# Patient Record
Sex: Male | Born: 1960 | Race: White | Hispanic: No | Marital: Married | Smoking: Never smoker
Health system: Southern US, Community
[De-identification: ages and names within clinical notes are randomized; demographics above are authoritative.]

## PROBLEM LIST (undated history)

## (undated) DIAGNOSIS — I10 Essential (primary) hypertension: Secondary | ICD-10-CM

## (undated) DIAGNOSIS — Z974 Presence of external hearing-aid: Secondary | ICD-10-CM

## (undated) DIAGNOSIS — H9319 Tinnitus, unspecified ear: Secondary | ICD-10-CM

## (undated) DIAGNOSIS — L4050 Arthropathic psoriasis, unspecified: Secondary | ICD-10-CM

## (undated) DIAGNOSIS — K219 Gastro-esophageal reflux disease without esophagitis: Secondary | ICD-10-CM

## (undated) DIAGNOSIS — K760 Fatty (change of) liver, not elsewhere classified: Secondary | ICD-10-CM

## (undated) HISTORY — DX: Fatty (change of) liver, not elsewhere classified: K76.0

## (undated) HISTORY — DX: Essential (primary) hypertension: I10

## (undated) HISTORY — PX: OTHER SURGICAL HISTORY: SHX169

## (undated) HISTORY — PX: SHOULDER SURGERY: SHX246

## (undated) HISTORY — PX: ANKLE SURGERY: SHX546

## (undated) HISTORY — DX: Gastro-esophageal reflux disease without esophagitis: K21.9

## (undated) HISTORY — DX: Arthropathic psoriasis, unspecified: L40.50

## (undated) HISTORY — PX: KNEE SURGERY: SHX244

---

## 2002-05-14 ENCOUNTER — Emergency Department (HOSPITAL_COMMUNITY): Admission: EM | Admit: 2002-05-14 | Discharge: 2002-05-14 | Payer: Self-pay | Admitting: Emergency Medicine

## 2002-11-03 ENCOUNTER — Emergency Department (HOSPITAL_COMMUNITY): Admission: EM | Admit: 2002-11-03 | Discharge: 2002-11-03 | Payer: Self-pay | Admitting: Emergency Medicine

## 2003-01-18 ENCOUNTER — Encounter: Payer: Self-pay | Admitting: Internal Medicine

## 2003-01-18 ENCOUNTER — Ambulatory Visit (HOSPITAL_COMMUNITY): Admission: RE | Admit: 2003-01-18 | Discharge: 2003-01-18 | Payer: Self-pay | Admitting: Internal Medicine

## 2003-04-22 ENCOUNTER — Emergency Department (HOSPITAL_COMMUNITY): Admission: EM | Admit: 2003-04-22 | Discharge: 2003-04-22 | Payer: Self-pay | Admitting: Emergency Medicine

## 2005-07-31 ENCOUNTER — Ambulatory Visit (HOSPITAL_COMMUNITY): Admission: RE | Admit: 2005-07-31 | Discharge: 2005-07-31 | Payer: Self-pay | Admitting: Internal Medicine

## 2007-08-13 ENCOUNTER — Ambulatory Visit: Payer: Self-pay | Admitting: Internal Medicine

## 2007-10-02 ENCOUNTER — Ambulatory Visit: Payer: Self-pay | Admitting: Internal Medicine

## 2007-10-02 ENCOUNTER — Ambulatory Visit (HOSPITAL_COMMUNITY): Admission: RE | Admit: 2007-10-02 | Discharge: 2007-10-02 | Payer: Self-pay | Admitting: Internal Medicine

## 2007-10-02 HISTORY — PX: OTHER SURGICAL HISTORY: SHX169

## 2010-01-22 ENCOUNTER — Emergency Department (HOSPITAL_COMMUNITY): Admission: EM | Admit: 2010-01-22 | Discharge: 2010-01-22 | Payer: Self-pay | Admitting: Emergency Medicine

## 2010-12-27 ENCOUNTER — Encounter (HOSPITAL_BASED_OUTPATIENT_CLINIC_OR_DEPARTMENT_OTHER)
Admission: RE | Admit: 2010-12-27 | Discharge: 2010-12-27 | Disposition: A | Discharge status: Home / Self Care | Payer: PRIVATE HEALTH INSURANCE | Source: Ambulatory Visit | Attending: Orthopedic Surgery | Admitting: Orthopedic Surgery

## 2010-12-27 LAB — BASIC METABOLIC PANEL
BUN: 18 mg/dL (ref 6–23)
CO2: 23 mEq/L (ref 19–32)
Calcium: 9.5 mg/dL (ref 8.4–10.5)
Chloride: 104 mEq/L (ref 96–112)
Creatinine, Ser: 0.72 mg/dL (ref 0.4–1.5)
GFR calc Af Amer: >60 mL/min (ref >60)
GFR calc non Af Amer: >60 mL/min (ref >60)
Glucose, Bld: 221 mg/dL — ABNORMAL HIGH (ref 70–99)
Potassium: 4.1 mEq/L (ref 3.5–5.1)
Sodium: 137 mEq/L (ref 135–145)

## 2010-12-28 ENCOUNTER — Ambulatory Visit (HOSPITAL_BASED_OUTPATIENT_CLINIC_OR_DEPARTMENT_OTHER)
Admission: RE | Admit: 2010-12-28 | Discharge: 2010-12-28 | Disposition: A | Discharge status: Home / Self Care | Payer: PRIVATE HEALTH INSURANCE | Source: Ambulatory Visit | Attending: Orthopedic Surgery | Admitting: Orthopedic Surgery

## 2010-12-28 DIAGNOSIS — Z01812 Encounter for preprocedural laboratory examination: Secondary | ICD-10-CM | POA: Insufficient documentation

## 2010-12-28 DIAGNOSIS — M719 Bursopathy, unspecified: Secondary | ICD-10-CM | POA: Insufficient documentation

## 2010-12-28 DIAGNOSIS — Z5333 Arthroscopic surgical procedure converted to open procedure: Secondary | ICD-10-CM | POA: Insufficient documentation

## 2010-12-28 DIAGNOSIS — J45909 Unspecified asthma, uncomplicated: Secondary | ICD-10-CM | POA: Insufficient documentation

## 2010-12-28 DIAGNOSIS — M24119 Other articular cartilage disorders, unspecified shoulder: Secondary | ICD-10-CM | POA: Insufficient documentation

## 2010-12-28 DIAGNOSIS — M25819 Other specified joint disorders, unspecified shoulder: Secondary | ICD-10-CM | POA: Insufficient documentation

## 2010-12-28 DIAGNOSIS — M67919 Unspecified disorder of synovium and tendon, unspecified shoulder: Secondary | ICD-10-CM | POA: Insufficient documentation

## 2010-12-28 DIAGNOSIS — I10 Essential (primary) hypertension: Secondary | ICD-10-CM | POA: Insufficient documentation

## 2010-12-28 DIAGNOSIS — E119 Type 2 diabetes mellitus without complications: Secondary | ICD-10-CM | POA: Insufficient documentation

## 2010-12-28 DIAGNOSIS — M948X9 Other specified disorders of cartilage, unspecified sites: Secondary | ICD-10-CM | POA: Insufficient documentation

## 2010-12-28 LAB — GLUCOSE, CAPILLARY
Glucose-Capillary: 175 mg/dL — ABNORMAL HIGH (ref 70–99)
Glucose-Capillary: 224 mg/dL — ABNORMAL HIGH (ref 70–99)

## 2011-01-16 NOTE — Op Note (Signed)
Steve Mccormick, Steve Mccormick           ACCOUNT NO.:  [id number]  MEDICAL RECORD NO.:  [id number]          PATIENT TYPE:  LOCATION:                                 FACILITY:  PHYSICIAN:  Loreta Ave, M.D.      DATE OF BIRTH:  DATE OF PROCEDURE:  12/28/2010 DATE OF DISCHARGE:                              OPERATIVE REPORT   PREOPERATIVE DIAGNOSES:  Right shoulder impingement, rotator cuff tear. Marked distal clavicle osteolysis.  POSTOPERATIVE DIAGNOSES:  Right shoulder impingement, rotator cuff tear. Marked distal clavicle osteolysis, but also with extensive tearing of intraarticular portion biceps tendon and superior labrum.  A full- thickness tear of the entire crescent region supraspinatus tendon.  PROCEDURES:  Right shoulder exam under anesthesia, arthroscopy. Debridement of labrum.  Released resection of intraarticular portion biceps with an open reimplantation into the bicipital groove with a docking tunnel.  Bursectomy, acromioplasty, CA ligament release. Excision of distal clavicle.  Arthroscopic-assisted cuff repair, fiber weave suture x2, SwiveLock anchors x2.  SURGEON:  Loreta Ave, MD  ASSISTANT:  Genene Churn. Barry Dienes, [place], present throughout the entire case and necessary for timely completion of procedure.  ANESTHESIA:  General.  BLOOD LOSS:  Minimal.  SPECIMENS:  None.  CULTURES:  None.  COMPLICATIONS:  None.  DRESSING:  Soft compressive shoulder immobilizer.  PROCEDURE:  The patient was brought to the operating room and placed in the operating table in supine position.  After adequate anesthesia had been obtained, shoulder was examined.  Full motion, stable shoulder. Placed in a beach-chair position on the shoulder positioner, prepped and draped in the usual sterile fashion.  Three portals, anterior, posterior, and lateral.  Arthroscope was introduced.  The shoulder distended and inspected.  Full thickness tear, entire crescent region, supraspinatus  mobile, reparable.  Debrided.  Tuberosity roughened from above and below.  Complex tearing of superior labrum was debrided.  This extended into marked tearing of almost the entirety of the intraarticular portion biceps.  This was released, the intraarticular portion debrided for extraarticular tenodesis.  The labrum debrided to a stable surface.  Remaining structures in shoulders looked good.  Cannula was redirected subacromially.  Type 2-3 acromion.  Reactive bursitis. Bursa was resected.  Cuff was assessed and mobilized for repair. Acromioplasty to a type 1 acromion with shaver and high-speed bur releasing CA ligament.  Distal clavicle grade 4 changes.  Periarticular spurs, lateral centimeter of clavicle resected.  Adequacy of decompression confirmed viewing throughout.  Instruments and fluid were removed.  A small longitudinal incision was made over the bicipital groove.  Skin and subcutaneous tissue were divided.  Blunt dissection, splitting the deltoid exposing the bicipital groove.  Biceps tendon brought out, cut to appropriate length, and captured with a FiberWire suture.  A drill hole was made into the bicipital groove with 2 small exiting holes.  FiberWire was brought in the larger hole and half smaller holes.  This was then tensioned pulling the biceps into the docking tunnel and suture over top with the FiberWire.  Nice firm reimplantation confirmed.  The wound was irrigated and closed with Vicryl.  Arthroscope was reintroduced.  With the cannula laterally, I  captured the cuff with 2 horizontal mattress sutures with FiberWire. These were firmly anchored and fixed to the tuberosity with 2 SwiveLock anchors.  Nice firm watertight closure of the cuff achieved.  I could bring it through full passive motion without too much tension on the repair completion.  Instruments and fluid were removed.  Portals were closed with nylon.  The incision was closed, subcutaneous and subcuticular  Vicryl.  Sterile compressive dressing was applied. Shoulder mobilizer was applied.  Anesthesia was reversed.  Brought to the recovery room.  Tolerated the surgery well.  No complications.     Loreta Ave, M.D.     DFM/MEDQ  D:  12/28/2010  T:  12/29/2010  Job:  [id number]  Electronically Signed by Mckinley Jewel M.D. on 01/16/2011 01:31:08 PM

## 2011-01-23 NOTE — Consult Note (Signed)
NAME:  BION, TODOROV            ACCOUNT NO.:  [id number]   MEDICAL RECORD NO.:  [id number]          PATIENT TYPE:  AMB   LOCATION:  DAY                           FACILITY:  [facility]   PHYSICIAN:  R. Roetta Sessions, M.D. DATE OF BIRTH:  1960/12/19   DATE OF CONSULTATION:  DATE OF DISCHARGE:                                 CONSULTATION   PATIENT NAME:  Steve Mccormick.   MEDICAL RECORD NUMBER:  [id number].   DATE OF BIRTH:  [birth year].   DATE OF CONSULTATION:  August 13, 2007.   REASON FOR CONSULTATION:  Hemoccult positive stools.   PHYSICIAN REQUESTING CONSULTATION:  Kingsley Callander. Ouida Sills, MD.   PHYSICIAN COSIGNING NOTEJonathon Bellows, MD.   HISTORY OF PRESENT ILLNESS:  The patient is a 50 year old, Caucasian  gentleman, who presents today for further evaluation of hemoccult  positive stools recently detected on a digital rectal exam during a  physical exam.  The patient denies any fresh blood per rectum noted.  His bowel movements are regular.  Denies any abdominal pain, nausea, or  vomiting.  He occasionally has heartburn for which he takes Nexium.  Denies any dysphagia, odynophagia, or weight loss.  His maternal  grandmother had colorectal cancer in her 48s.  His mother has had a  history of colonic polyps.  He has never had a colonoscopy.   CURRENT MEDICATIONS:  1. Metformin 1000 mg daily.  2. Nexium 40 mg p.r.n.   ALLERGIES:  No known drug allergies.   PAST MEDICAL HISTORY:  Diabetes mellitus.   PAST SURGICAL HISTORY:  A left knee surgery for torn cartilage.   FAMILY HISTORY:  Mother is 29 years old and has a history of colonic  polyps, diabetes; maternal grandmother deceased secondary to colorectal  cancer in her 13s.   SOCIAL HISTORY:  He is married.  He has one living son.  One son died in  an MVA four years ago around age 81.  He works for the [employer].  He has never been a smoker.  He rarely consumes  alcohol.   REVIEW OF  SYSTEMS:  See HPI for GI.  CONSTITUTIONAL:  No unintentional  weight loss.  CARDIOPULMONARY:  No chest pain or shortness of breath.   PHYSICAL EXAMINATION:  VITAL SIGNS:  Weight 277, height 6 foot, temp  98.3, blood pressure 140/90, pulse 80.  GENERAL:  A pleasant, well-nourished, well-developed, Caucasian male in  no acute distress.  SKIN:  Warm and dry, no jaundice.  HEENT:  Sclerae are nonicteric, oropharyngeal mucosa moist and pink, no  lesions, erythema, or exudate.  NECK:  No lymphadenopathy or thyromegaly.  CHEST:  Lungs are clear to auscultation.  CARDIAC:  Regular rate and rhythm, normal S1 S2, no murmurs, rubs, or  gallops.  ABDOMEN:  Positive bowel sounds, obese but symmetrical, soft.  Nontender, nondistended, no organomegaly or masses, no rebound  tenderness or guarding, no abdominal bruits or hernias.  EXTREMITIES:  No edema.   IMPRESSION:  Mr. Pelc is a 50 year old, Caucasian gentleman, who  recently was found to have hemoccult-positive stool on digital  rectal  examination.  He has a family history of colorectal cancer and colonic  polyps.  He has never had a colonoscopy.  Would recommend colonoscopy  for further evaluation.   PLAN:  1. Colonoscopy in the near future with Dr. Jena Gauss.  2. He will take Metformin 500 mg the day of his prep.   I would like to thank Dr. Carylon Perches for allowing [place] to take part in the  care of this patient.      Tana Coast, P.AJonathon Bellows, M.D.  Electronically Signed    LL/MEDQ  D:  08/13/2007  T:  08/13/2007  Job:  [id number]   cc:   Kingsley Callander. Ouida Sills, MD  Fax: [phone]

## 2011-01-23 NOTE — Op Note (Signed)
NAME:  CHALMER, ZHENG            ACCOUNT NO.:  [id number]   MEDICAL RECORD NO.:  [id number]          PATIENT TYPE:  AMB   LOCATION:  DAY                           FACILITY:  [facility]   PHYSICIAN:  R. Roetta Sessions, M.D. DATE OF BIRTH:  [birth year]   DATE OF PROCEDURE:  10/02/2007  DATE OF DISCHARGE:                               OPERATIVE REPORT   PROCEDURE:  Ileocolonoscopy diagnostic.   INDICATIONS FOR PROCEDURE:  A 50 year old gentleman who was found to be  Hemoccult-positive on digital rectal exam recently.  He is not having  any lower GI tract symptoms such as hematochezia or melena; and no  abdominal pain.  No upper GI tract symptoms, aside from some heartburn  or reflux symptoms (which is well-controlled on Nexium 40 mg p.r.n.).   FAMILY HISTORY:  Significant in that mother has had colonic polyps, and  grandmother had colorectal cancer in her 48s.   Mr. Tith has never had his lower GI tract evaluated.  Colonoscopy is  now being done.  This approach has been discussed with the patient at  length.  The potential risks, benefits and alternatives have been  reviewed and questions answered.  He is agreeable.  Please see  documentation in the medical record.   PROCEDURE NOTE:  Oxygen saturation, blood pressure, pulse and  respirations were monitored throughout the entire procedure.  Conscious  sedation with Versed 5 mg IV, Demerol 125 mg IV in divided doses.  Instrument was Pentax video chip system.   FINDINGS:  Digital rectal exam revealed no abnormalities.  The prep was  adequate.  COLON:  Colonic mucosa was surveyed from the rectosigmoid junction  through to the left transverse right colon.  The appendiceal orifice,  ileocecal valve and cecum.  Mucosal surfaces were seen well, seen and  photographed for the record.  Terminal ileum was intubated to 10 cm.  From this level the scope was slowly withdrawn.  All previous mucosal  surfaces were again seen.  The patient was noted  to have scattered left-  sided diverticula, but the colonic mucosa appeared entirely normal as  did the terminal ileum mucosa.  The Scope was pulled down into the  rectum, where a  thorough examination of the rectal mucosa, including  retroflex view of the anal verge, demonstrated friable anal canal only.   The patient tolerated the procedure well and was reacted in endoscopy.   IMPRESSION:  Friable anal canal, otherwise normal rectum.  Left-sided  diverticula.  The colonic mucosa and  terminal ileum mucosa appeared  normal.   RECOMMENDATIONS:  1. Diverticulosis.  Literature provided for Mr. Castillo.  2. Unless the patient were to display other signs and symptoms of      gastrointestinal bleeding, no further evaluation was warranted at      this time.  I have recommended that he return for his next      colonoscopy in 5 years.      Jonathon Bellows, M.D.  Electronically Signed     RMR/MEDQ  D:  10/02/2007  T:  10/02/2007  Job:  [id number]   cc:  Kingsley Callander. Ouida Sills, MD  Fax: [phone]

## 2011-06-07 ENCOUNTER — Ambulatory Visit (HOSPITAL_BASED_OUTPATIENT_CLINIC_OR_DEPARTMENT_OTHER)
Admission: RE | Admit: 2011-06-07 | Discharge: 2011-06-07 | Disposition: A | Discharge status: Home / Self Care | Payer: PRIVATE HEALTH INSURANCE | Source: Ambulatory Visit | Attending: Orthopedic Surgery | Admitting: Orthopedic Surgery

## 2011-06-07 DIAGNOSIS — E119 Type 2 diabetes mellitus without complications: Secondary | ICD-10-CM | POA: Insufficient documentation

## 2011-06-07 DIAGNOSIS — K219 Gastro-esophageal reflux disease without esophagitis: Secondary | ICD-10-CM | POA: Insufficient documentation

## 2011-06-07 DIAGNOSIS — M23319 Other meniscus derangements, anterior horn of medial meniscus, unspecified knee: Secondary | ICD-10-CM | POA: Insufficient documentation

## 2011-06-07 DIAGNOSIS — J45909 Unspecified asthma, uncomplicated: Secondary | ICD-10-CM | POA: Insufficient documentation

## 2011-06-07 LAB — POCT I-STAT, CHEM 8
BUN: 19 mg/dL (ref 6–23)
Creatinine, Ser: 0.6 mg/dL (ref 0.50–1.35)
HCT: 41 % (ref 39.0–52.0)
Hemoglobin: 13.9 g/dL (ref 13.0–17.0)
Potassium: 4.3 mEq/L (ref 3.5–5.1)
Sodium: 136 mEq/L (ref 135–145)
TCO2: 20 mmol/L (ref 0–100)

## 2011-06-07 LAB — GLUCOSE, CAPILLARY: Glucose-Capillary: 153 mg/dL — ABNORMAL HIGH (ref 70–99)

## 2011-06-13 NOTE — Op Note (Signed)
  [name], Steve Mccormick            ACCOUNT NO.:  [id number]  MEDICAL RECORD NO.:  [id number]  LOCATION:                                 FACILITY:  PHYSICIAN:  Loreta Ave, M.D.      DATE OF BIRTH:  DATE OF PROCEDURE:  06/07/2011 DATE OF DISCHARGE:                              OPERATIVE REPORT   PREOPERATIVE DIAGNOSIS:  Right knee anterior third medial meniscus tear with parameniscal cyst.  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  A focal grade 2 to 3 chondral flap posterior aspect of the medial femoral condyle.  PROCEDURE:  Right knee exam under anesthesia arthroscopy.  Debridement, anterior horn of the medial meniscus, excision of cyst.  Chondroplasty, removal of chondral flap medial femoral condyle with chondroplasty of the trochlea.  SURGEON:  Loreta Ave, M.D.  ASSISTANT:  Genene Churn. Denton Meek.  ANESTHESIA:  General.  BLOOD LOSS:  Minimal.  TOURNIQUET:  Not employed.  SPECIMEN:  None.  COMPLICATIONS:  None.  DRESSING:  Soft compressive.  PROCEDURE:  The patient brought to the operating room, placed in the operating table supine position.  After adequate anesthesia had been obtained, leg holder applied.  Leg prepped and draped in usual sterile fashion.  Two portals, one each in medial and lateral parapatellar. Arthroscope was introduced.  Knee was distended and inspected. Patellofemoral joint good tracking.  A little grade 2 changes laterally, debrided.  Inferior aspect of trochlea centrally contained lesion grade 3, debrided throughout.  Some of this approaching grade 4 thin but it was a contained lesion.  Cruciate ligaments were intact.  Lateral meniscus, lateral compartment normal.  Parameniscal cyst with fraying tearing of the anterior third medial meniscus.  Anterior third removed along the cyst, tapered into medial meniscus, salvaging  the posterior two thirds which were intact.  On the posterior aspect of the medial femoral condyle, there was a chondral flap 1 x 1 cm.  The flap which was unstable removed.  Chondroplasty to a stable surface.  Not full- thickness.  Entire knee examined.  No other findings appreciated. Instruments, fluid removed.  Portals were injected with Marcaine.  Knee injected with Depo- Medrol.  Portals closed with nylon.  Sterile compressive dressing applied.  Anesthesia reversed.  Brought to the recovery room.  Tolerated surgery well.  No complications.     Loreta Ave, M.D.     DFM/MEDQ  D:  06/07/2011  T:  06/07/2011  Job:  [id number]  Electronically Signed by Mckinley Jewel M.D. on 06/13/2011 02:32:54 PM

## 2011-12-15 ENCOUNTER — Encounter (HOSPITAL_COMMUNITY): Payer: Self-pay | Admitting: Emergency Medicine

## 2011-12-15 ENCOUNTER — Emergency Department (HOSPITAL_COMMUNITY): Payer: PRIVATE HEALTH INSURANCE

## 2011-12-15 ENCOUNTER — Emergency Department (HOSPITAL_COMMUNITY)
Admission: EM | Admit: 2011-12-15 | Discharge: 2011-12-15 | Disposition: A | Discharge status: Home / Self Care | Payer: PRIVATE HEALTH INSURANCE | Attending: Emergency Medicine | Admitting: Emergency Medicine

## 2011-12-15 DIAGNOSIS — M545 Low back pain, unspecified: Secondary | ICD-10-CM | POA: Insufficient documentation

## 2011-12-15 DIAGNOSIS — S39012A Strain of muscle, fascia and tendon of lower back, initial encounter: Secondary | ICD-10-CM

## 2011-12-15 DIAGNOSIS — M5136 Other intervertebral disc degeneration, lumbar region: Secondary | ICD-10-CM

## 2011-12-15 DIAGNOSIS — M5137 Other intervertebral disc degeneration, lumbosacral region: Secondary | ICD-10-CM | POA: Insufficient documentation

## 2011-12-15 DIAGNOSIS — R350 Frequency of micturition: Secondary | ICD-10-CM | POA: Insufficient documentation

## 2011-12-15 DIAGNOSIS — S335XXA Sprain of ligaments of lumbar spine, initial encounter: Secondary | ICD-10-CM | POA: Insufficient documentation

## 2011-12-15 DIAGNOSIS — X58XXXA Exposure to other specified factors, initial encounter: Secondary | ICD-10-CM | POA: Insufficient documentation

## 2011-12-15 DIAGNOSIS — M51379 Other intervertebral disc degeneration, lumbosacral region without mention of lumbar back pain or lower extremity pain: Secondary | ICD-10-CM | POA: Insufficient documentation

## 2011-12-15 DIAGNOSIS — E119 Type 2 diabetes mellitus without complications: Secondary | ICD-10-CM | POA: Insufficient documentation

## 2011-12-15 LAB — GLUCOSE, CAPILLARY: Glucose-Capillary: 127 mg/dL — ABNORMAL HIGH (ref 70–99)

## 2011-12-15 LAB — URINALYSIS, ROUTINE W REFLEX MICROSCOPIC
Glucose, UA: 100 mg/dL — AB
Leukocytes, UA: NEGATIVE
Nitrite: NEGATIVE
Protein, ur: NEGATIVE mg/dL
Urobilinogen, UA: 0.2 mg/dL (ref 0.0–1.0)
pH: 6 (ref 5.0–8.0)

## 2011-12-15 MED ORDER — OXYCODONE-ACETAMINOPHEN 5-325 MG PO TABS: 1.0000 | ORAL_TABLET | ORAL | Status: AC | PRN

## 2011-12-15 MED ORDER — METHOCARBAMOL 500 MG PO TABS
1000.0000 mg | ORAL_TABLET | Freq: Once | ORAL | Status: AC
  Administered 2011-12-15: 1000 mg via ORAL
  Filled 2011-12-15: qty 2

## 2011-12-15 MED ORDER — METHOCARBAMOL 500 MG PO TABS: 1000.0000 mg | ORAL_TABLET | Freq: Three times a day (TID) | ORAL | Status: AC

## 2011-12-15 MED ORDER — HYDROMORPHONE HCL PF 2 MG/ML IJ SOLN
2.0000 mg | Freq: Once | INTRAMUSCULAR | Status: AC
  Administered 2011-12-15: 2 mg via INTRAMUSCULAR
  Filled 2011-12-15: qty 1

## 2011-12-15 MED ORDER — OXYCODONE-ACETAMINOPHEN 5-325 MG PO TABS
2.0000 | ORAL_TABLET | Freq: Once | ORAL | Status: AC
  Administered 2011-12-15: 2 via ORAL
  Filled 2011-12-15: qty 2

## 2011-12-15 MED ORDER — ONDANSETRON 8 MG PO TBDP
8.0000 mg | ORAL_TABLET | Freq: Once | ORAL | Status: AC
  Administered 2011-12-15: 8 mg via ORAL
  Filled 2011-12-15: qty 1

## 2011-12-15 NOTE — ED Notes (Signed)
Pt went camping last weekend and on the way home felt increasing back pain. PCP put pt on Prednisone.  Pt states pain got better but now really bad.

## 2011-12-15 NOTE — ED Notes (Signed)
Pt a/ox4. Resp even and unlabored. NAD at this time. D/C instructions reviewed with pt. Pt verbalized understanding. Pt ambulated to lobby with steady gate.  

## 2011-12-15 NOTE — Discharge Instructions (Signed)
Back Pain, Adult Low back pain is very common. About 1 in 5 people have back pain.The cause of low back pain is rarely dangerous. The pain often gets better over time.About half of people with a sudden onset of back pain feel better in just 2 weeks. About 8 in 10 people feel better by 6 weeks.  CAUSES Some common causes of back pain include:  Strain of the muscles or ligaments supporting the spine.   Wear and tear (degeneration) of the spinal discs.   Arthritis.   Direct injury to the back.  DIAGNOSIS Most of the time, the direct cause of low back pain is not known.However, back pain can be treated effectively even when the exact cause of the pain is unknown.Answering your caregiver's questions about your overall health and symptoms is one of the most accurate ways to make sure the cause of your pain is not dangerous. If your caregiver needs more information, he or she may order lab work or imaging tests (X-rays or MRIs).However, even if imaging tests show changes in your back, this usually does not require surgery. HOME CARE INSTRUCTIONS For many people, back pain returns.Since low back pain is rarely dangerous, it is often a condition that people can learn to manageon their own.   Remain active. It is stressful on the back to sit or stand in one place. Do not sit, drive, or stand in one place for more than 30 minutes at a time. Take short walks on level surfaces as soon as pain allows.Try to increase the length of time you walk each day.   Do not stay in bed.Resting more than 1 or 2 days can delay your recovery.   Do not avoid exercise or work.Your body is made to move.It is not dangerous to be active, even though your back may hurt.Your back will likely heal faster if you return to being active before your pain is gone.   Pay attention to your body when you bend and lift. Many people have less discomfortwhen lifting if they bend their knees, keep the load close to their  bodies,and avoid twisting. Often, the most comfortable positions are those that put less stress on your recovering back.   Find a comfortable position to sleep. Use a firm mattress and lie on your side with your knees slightly bent. If you lie on your back, put a pillow under your knees.   Only take over-the-counter or prescription medicines as directed by your caregiver. Over-the-counter medicines to reduce pain and inflammation are often the most helpful.Your caregiver may prescribe muscle relaxant drugs.These medicines help dull your pain so you can more quickly return to your normal activities and healthy exercise.   Put ice on the injured area.   Put ice in a plastic bag.   Place a towel between your skin and the bag.   Leave the ice on for 15 to 20 minutes, 3 to 4 times a day for the first 2 to 3 days. After that, ice and heat may be alternated to reduce pain and spasms.   Ask your caregiver about trying back exercises and gentle massage. This may be of some benefit.   Avoid feeling anxious or stressed.Stress increases muscle tension and can worsen back pain.It is important to recognize when you are anxious or stressed and learn ways to manage it.Exercise is a great option.  SEEK MEDICAL CARE IF:  You have pain that is not relieved with rest or medicine.   You have   pain that does not improve in 1 week.   You have new symptoms.   You are generally not feeling well.  SEEK IMMEDIATE MEDICAL CARE IF:   You have pain that radiates from your back into your legs.   You develop new bowel or bladder control problems.   You have unusual weakness or numbness in your arms or legs.   You develop nausea or vomiting.   You develop abdominal pain.   You feel faint.  Document Released: 08/27/2005 Document Revised: 08/16/2011 Document Reviewed: 01/15/2011 [facility] Patient Information 2012 [place], [place].  Continue using your prednisone.  Use the the other medicines as directed.   Do not drive within 4 hours of taking oxycodone as this will make you drowsy.  Avoid lifting,  Bending,  Twisting or any other activity that worsens your pain over the next week.  Apply a heating pad to your lower back 20 minutes several times daily.  You should get rechecked if your symptoms are not better over the next 5 days,  Or you develop increased pain,  Weakness in your leg(s) or loss of bladder or bowel function - these are symptoms of a worse injury.

## 2011-12-16 NOTE — ED Provider Notes (Signed)
History     CSN: [id number]  Arrival date & time 12/15/11  1827   First MD Initiated Contact with Patient 12/15/11 1841      Chief Complaint  Patient presents with  . Back Pain    (Consider location/radiation/quality/duration/timing/severity/associated sxs/prior treatment) Patient is a 51 y.o. male presenting with back pain. The history is provided by the patient.  Back Pain  This is a new problem. The current episode started more than 2 days ago. The problem occurs constantly. The problem has been gradually worsening. The pain is associated with no known injury (His pain started while driving home from a week camping at the beach.  He denies injury.). The pain is present in the lumbar spine. The quality of the pain is described as stabbing. The pain does not radiate. The pain is at a severity of 8/10. The pain is moderate. The symptoms are aggravated by bending, twisting and certain positions. Pertinent negatives include no chest pain, no fever, no numbness, no headaches, no abdominal pain, no bowel incontinence, no perianal numbness, no bladder incontinence, no leg pain, no paresthesias, no paresis, no tingling and no weakness. Associated symptoms comments: He has noticed urinating more frequently than normal,  Although states he goes frequently at baseline which he blames on his diabetes.  He denies dysuria,  Denies hematuria. . Treatments tried: PCP called in prednisone taper which he started yesterday. The treatment provided no relief.    Past Medical History  Diagnosis Date  . Diabetes mellitus     Past Surgical History  Procedure Date  . Joint replacement     No family history on file.  History  Substance Use Topics  . Smoking status: Never Smoker   . Smokeless tobacco: Not on file  . Alcohol Use: No      Review of Systems  Constitutional: Negative for fever.  HENT: Negative for congestion, sore throat and neck pain.   Eyes: Negative.   Respiratory: Negative for  chest tightness and shortness of breath.   Cardiovascular: Negative for chest pain.  Gastrointestinal: Negative for nausea, abdominal pain and bowel incontinence.  Genitourinary: Negative for bladder incontinence.  Musculoskeletal: Positive for back pain. Negative for joint swelling and arthralgias.  Skin: Negative.  Negative for rash and wound.  Neurological: Negative for dizziness, tingling, weakness, light-headedness, numbness, headaches and paresthesias.  Hematological: Negative.   Psychiatric/Behavioral: Negative.     Allergies  Review of patient's allergies indicates no known allergies.  Home Medications   Current Outpatient Rx  Name Route Sig Dispense Refill  . METHOCARBAMOL 500 MG PO TABS Oral Take 2 tablets (1,000 mg total) by mouth 3 (three) times daily. 30 tablet 0  . OXYCODONE-ACETAMINOPHEN 5-325 MG PO TABS Oral Take 1 tablet by mouth every 4 (four) hours as needed for pain. 20 tablet 0    BP 130/76  Pulse 98  Temp(Src) 97.7 F (36.5 C) (Oral)  Resp 20  Ht 6' (1.829 m)  Wt 265 lb (120.203 kg)  BMI 35.94 kg/m2  SpO2 98%  Physical Exam  Nursing note and vitals reviewed. Constitutional: He is oriented to person, place, and time. He appears well-developed and well-nourished.  HENT:  Head: Normocephalic.  Eyes: Conjunctivae are normal.  Neck: Normal range of motion. Neck supple.  Cardiovascular: Regular rhythm and intact distal pulses.        Pedal pulses normal.  Pulmonary/Chest: Effort normal. He has no wheezes.  Abdominal: Soft. Bowel sounds are normal. He exhibits no distension and no  mass.  Musculoskeletal: Normal range of motion. He exhibits no edema.       Lumbar back: He exhibits tenderness. He exhibits no swelling, no edema and no spasm.       Bilateral SLR positive at 30 degrees.  Neurological: He is alert and oriented to person, place, and time. He has normal strength. He displays no atrophy and no tremor. No cranial nerve deficit or sensory deficit.  Gait normal.  Reflex Scores:      Patellar reflexes are 2+ on the right side and 2+ on the left side.      Achilles reflexes are 2+ on the right side and 2+ on the left side.      No strength deficit noted in hip and knee flexor and extensor muscle groups.  Ankle flexion and extension intact.  Skin: Skin is warm and dry.  Psychiatric: He has a normal mood and affect.    ED Course  Procedures (including critical care time)  Labs Reviewed  URINALYSIS, ROUTINE W REFLEX MICROSCOPIC - Abnormal; Notable for the following:    Glucose, UA 100 (*)    All other components within normal limits  GLUCOSE, CAPILLARY - Abnormal; Notable for the following:    Glucose-Capillary 127 (*)    All other components within normal limits  LAB REPORT - SCANNED   Dg Lumbar Spine Complete  12/15/2011  *RADIOLOGY REPORT*  Clinical Data: Back pain  LUMBAR SPINE - COMPLETE 4+ VIEW  Comparison: None.  Findings: Negative for fracture.  Normal lumbar alignment.  Mild to moderate diffuse lumbar disc degeneration is present with relative sparing at L3-4.  Negative for pars defect.  IMPRESSION: Lumbar disc degeneration.  No acute bony abnormality.  Original Report Authenticated By: Camelia Phenes, M.D.     1. Lumbar strain   2. Degenerative disc disease, lumbar       MDM  Pt to continue steroid taper.  Prescribed percocet and robaxin.  Heat therapy.  Recheck by pcp if not improved over the next 4-5 days.  Discussed signs of worsened course.  No neuro deficit on exam or by history to suggest emergent or surgical presentation.  Xray reviewed.           Candis Musa, PA 12/16/11 1312

## 2011-12-16 NOTE — ED Provider Notes (Signed)
Medical screening examination/treatment/procedure(s) were performed by non-physician practitioner and as supervising physician I was immediately available for consultation/collaboration.   Glynn Octave, MD 12/16/11 [phone]

## 2011-12-31 ENCOUNTER — Other Ambulatory Visit: Payer: Self-pay | Admitting: Orthopedic Surgery

## 2011-12-31 DIAGNOSIS — IMO0002 Reserved for concepts with insufficient information to code with codable children: Secondary | ICD-10-CM

## 2012-01-01 ENCOUNTER — Ambulatory Visit
Admission: RE | Admit: 2012-01-01 | Discharge: 2012-01-01 | Disposition: A | Discharge status: Home / Self Care | Payer: PRIVATE HEALTH INSURANCE | Source: Ambulatory Visit | Attending: Orthopedic Surgery | Admitting: Orthopedic Surgery

## 2012-01-01 ENCOUNTER — Other Ambulatory Visit: Payer: Self-pay | Admitting: Orthopedic Surgery

## 2012-01-01 DIAGNOSIS — IMO0002 Reserved for concepts with insufficient information to code with codable children: Secondary | ICD-10-CM

## 2012-01-04 LAB — BODY FLUID CULTURE
Gram Stain: NONE SEEN
Organism ID, Bacteria: NO GROWTH

## 2012-01-06 LAB — ANAEROBIC CULTURE: Gram Stain: NONE SEEN

## 2012-08-19 ENCOUNTER — Encounter: Payer: Self-pay | Admitting: *Deleted

## 2012-09-19 ENCOUNTER — Telehealth: Payer: Self-pay | Admitting: *Deleted

## 2012-09-19 NOTE — Telephone Encounter (Signed)
Left a message for Steve Mccormick to call our office to set up an appointment for his screening TCS.

## 2012-10-06 ENCOUNTER — Encounter: Payer: Self-pay | Admitting: Internal Medicine

## 2012-10-07 ENCOUNTER — Other Ambulatory Visit: Payer: Self-pay | Admitting: Internal Medicine

## 2012-10-07 ENCOUNTER — Ambulatory Visit (INDEPENDENT_AMBULATORY_CARE_PROVIDER_SITE_OTHER): Payer: PRIVATE HEALTH INSURANCE | Admitting: Gastroenterology

## 2012-10-07 ENCOUNTER — Telehealth: Payer: Self-pay | Admitting: Gastroenterology

## 2012-10-07 ENCOUNTER — Encounter: Payer: Self-pay | Admitting: Gastroenterology

## 2012-10-07 VITALS — BP 131/70 | HR 88 | Temp 98.4°F | Ht 72.0 in | Wt 261.0 lb

## 2012-10-07 DIAGNOSIS — Z139 Encounter for screening, unspecified: Secondary | ICD-10-CM

## 2012-10-07 DIAGNOSIS — K760 Fatty (change of) liver, not elsewhere classified: Secondary | ICD-10-CM

## 2012-10-07 DIAGNOSIS — K7689 Other specified diseases of liver: Secondary | ICD-10-CM

## 2012-10-07 MED ORDER — SOD PICOSULFATE-MAG OX-CIT ACD 10-3.5-12 MG-GM-GM PO PACK: 1.0000 | PACK | ORAL | Status: DC

## 2012-10-07 NOTE — Telephone Encounter (Signed)
Please have patient only take 1/2 dose of oral diabetes medication the EVENING before. None the day of.  I discussed this with him at the time of the visit; please just remind him. Thanks!

## 2012-10-07 NOTE — Progress Notes (Signed)
Primary Care Physician:  Dr. Ouida Sills Primary Gastroenterologist:  Dr. Jena Gauss   Chief Complaint  Patient presents with  . Colonoscopy    HPI:   52 year old pleasant male with family history of colon polyps and second-degree relative with hx of colon cancer, presenting today for routine updated colonoscopy. He has no first-degree relatives with colon cancer. His last TCS was in 2009 by Dr. Jena Gauss, with a friable anal canal, left-sided diverticula. He also has a hx of fatty liver. Last [place] on file in remote past. He notes he recently had blood work done at PCP and was told his LFTs were normal. Denies abdominal pain, no change in bowel habits. No rectal bleeding. No loss of appetite, N/V. Working on losing weight, intentionally, through diet and exercise. Intermittent reflux but will take Nexium prn.   Past Medical History  Diagnosis Date  . Diabetes mellitus   . Hypertension   . GERD (gastroesophageal reflux disease)   . Fatty liver     Past Surgical History  Procedure Date  . Ileocolonoscopy 10/02/2007    RMR: Friable anal canal, otherwise normal rectum.  Left-sided diverticula.  The colonic mucosa and  terminal ileum mucosa appeared normal rectum, left-sided diverticula  . Plantar fasciitis   . Ankle surgery     bilateral  . Knee surgery     bilateral  . Shoulder surgery     bilateral    Current Outpatient Prescriptions  Medication Sig Dispense Refill  . cetirizine (ZYRTEC) 10 MG chewable tablet Chew 10 mg by mouth daily.      Marland Kitchen dicyclomine (BENTYL) 10 MG capsule Take 10 mg by mouth as needed.      Marland Kitchen esomeprazole (NEXIUM) 40 MG capsule Take 40 mg by mouth daily before breakfast.      . gabapentin (NEURONTIN) 600 MG tablet Take 600 mg by mouth daily.      Marland Kitchen glipiZIDE (GLUCOTROL) 10 MG tablet Take 10 mg by mouth daily.      . Liraglutide (VICTOZA [place]) Inject 1.2 mg into the skin.      Marland Kitchen lisinopril (PRINIVIL,ZESTRIL) 10 MG tablet Take 10 mg by mouth daily.      . metFORMIN  (GLUCOPHAGE) 1000 MG tablet Take 2,000 mg by mouth daily with breakfast.      . Sod Picosulfate-Mag Ox-Cit Acd 10-3.5-12 MG-GM-GM PACK Take 1 Container by mouth as directed.  1 each  0    Allergies as of 10/07/2012  . (No Known Allergies)    Family History  Problem Relation Age of Onset  . Colon cancer Maternal Grandmother   . Colon polyps Mother     History   Social History  . Marital Status: Married    Spouse Name: N/A    Number of Children: N/A  . Years of Education: N/A   Occupational History  . [employer]   Social History Main Topics  . Smoking status: Never Smoker   . Smokeless tobacco: Not on file  . Alcohol Use: No  . Drug Use: No  . Sexually Active: Not on file   Other Topics Concern  . Not on file   Social History Narrative  . No narrative on file    Review of Systems: Gen: Denies any fever, chills, fatigue, weight loss, lack of appetite.  CV: Denies chest pain, heart palpitations, peripheral edema, syncope.  Resp: Denies shortness of breath at rest or with exertion. Denies wheezing or cough.  GI: Denies dysphagia or odynophagia. Denies jaundice, hematemesis,  fecal incontinence. GU : Denies urinary burning, urinary frequency, urinary hesitancy MS: +joint pain, arthritis in back Derm: Denies rash, itching, dry skin Psych: Denies depression, anxiety, memory loss, and confusion Heme: Denies bruising, bleeding, and enlarged lymph nodes.  Physical Exam: BP 131/70  Pulse 88  Temp 98.4 F (36.9 C) (Oral)  Ht 6' (1.829 m)  Wt 261 lb (118.389 kg)  BMI 35.40 kg/m2 General:   Alert and oriented. Pleasant and cooperative. Well-nourished and well-developed.  Head:  Normocephalic and atraumatic. Eyes:  Without icterus, sclera clear and conjunctiva pink.  Ears:  Normal auditory acuity. Nose:  No deformity, discharge,  or lesions. Mouth:  No deformity or lesions, oral mucosa pink.  Neck:  Supple, without mass or thyromegaly. Lungs:  Clear to  auscultation bilaterally. No wheezes, rales, or rhonchi. No distress.  Heart:  S1, S2 present without murmurs appreciated.  Abdomen:  +BS, soft, non-tender and non-distended. No HSM noted. No guarding or rebound. No masses appreciated.  Rectal:  Deferred  Msk:  Symmetrical without gross deformities. Normal posture. Extremities:  Without clubbing or edema. Neurologic:  Alert and  oriented x4;  grossly normal neurologically. Skin:  Intact without significant lesions or rashes. Cervical Nodes:  No significant cervical adenopathy. Psych:  Alert and cooperative. Normal mood and affect.

## 2012-10-07 NOTE — Patient Instructions (Addendum)
We have set you up for a colonoscopy with Dr. Jena Gauss in the near future.  Enjoy the snow!

## 2012-10-08 ENCOUNTER — Encounter (HOSPITAL_COMMUNITY): Payer: Self-pay | Admitting: Pharmacy Technician

## 2012-10-08 ENCOUNTER — Encounter: Payer: Self-pay | Admitting: Gastroenterology

## 2012-10-08 DIAGNOSIS — K760 Fatty (change of) liver, not elsewhere classified: Secondary | ICD-10-CM | POA: Insufficient documentation

## 2012-10-08 DIAGNOSIS — Z139 Encounter for screening, unspecified: Secondary | ICD-10-CM | POA: Insufficient documentation

## 2012-10-08 NOTE — Progress Notes (Signed)
Faxed to PCP

## 2012-10-08 NOTE — Telephone Encounter (Signed)
Ive discussed this with the patient and he is aware

## 2012-10-08 NOTE — Assessment & Plan Note (Signed)
52 year old male due for routine screening colonoscopy; notable FH of colon polyps and 2nd degree relative with colon cancer. He is slated for screening this year. Last colonoscopy in 2009 without evidence of polyps by Dr. Jena Gauss. He has no lower GI symptoms, and he is purposefully trying to lose weight through diet and exercise.   Proceed with TCS with Dr. Jena Gauss in near future: the risks, benefits, and alternatives have been discussed with the patient in detail. The patient states understanding and desires to proceed.

## 2012-10-08 NOTE — Assessment & Plan Note (Signed)
Notes last LFTs were normal per his PCP. We will obtain these for our records. He was applauded on weight loss efforts and encouraged to continue.

## 2012-10-22 ENCOUNTER — Ambulatory Visit (HOSPITAL_COMMUNITY)
Admission: RE | Admit: 2012-10-22 | Discharge: 2012-10-22 | Disposition: A | Discharge status: Home / Self Care | Payer: PRIVATE HEALTH INSURANCE | Source: Ambulatory Visit | Attending: Internal Medicine | Admitting: Internal Medicine

## 2012-10-22 ENCOUNTER — Encounter (HOSPITAL_COMMUNITY): Payer: Self-pay | Admitting: *Deleted

## 2012-10-22 ENCOUNTER — Encounter (HOSPITAL_COMMUNITY)
Admission: RE | Disposition: A | Discharge status: Home / Self Care | Payer: Self-pay | Source: Ambulatory Visit | Attending: Internal Medicine

## 2012-10-22 DIAGNOSIS — K5730 Diverticulosis of large intestine without perforation or abscess without bleeding: Secondary | ICD-10-CM | POA: Insufficient documentation

## 2012-10-22 DIAGNOSIS — I10 Essential (primary) hypertension: Secondary | ICD-10-CM | POA: Insufficient documentation

## 2012-10-22 DIAGNOSIS — Z1211 Encounter for screening for malignant neoplasm of colon: Secondary | ICD-10-CM

## 2012-10-22 DIAGNOSIS — E119 Type 2 diabetes mellitus without complications: Secondary | ICD-10-CM | POA: Insufficient documentation

## 2012-10-22 DIAGNOSIS — Z85038 Personal history of other malignant neoplasm of large intestine: Secondary | ICD-10-CM | POA: Insufficient documentation

## 2012-10-22 DIAGNOSIS — Z01812 Encounter for preprocedural laboratory examination: Secondary | ICD-10-CM | POA: Insufficient documentation

## 2012-10-22 DIAGNOSIS — Z800 Family history of malignant neoplasm of digestive organs: Secondary | ICD-10-CM

## 2012-10-22 DIAGNOSIS — Z8371 Family history of colonic polyps: Secondary | ICD-10-CM

## 2012-10-22 HISTORY — PX: COLONOSCOPY: SHX5424

## 2012-10-22 LAB — GLUCOSE, CAPILLARY: Glucose-Capillary: 149 mg/dL — ABNORMAL HIGH (ref 70–99)

## 2012-10-22 SURGERY — COLONOSCOPY
Anesthesia: Moderate Sedation

## 2012-10-22 MED ORDER — MEPERIDINE HCL 100 MG/ML IJ SOLN
INTRAMUSCULAR | Status: AC
  Filled 2012-10-22: qty 1

## 2012-10-22 MED ORDER — ONDANSETRON HCL 4 MG/2ML IJ SOLN
INTRAMUSCULAR | Status: AC
  Filled 2012-10-22: qty 2

## 2012-10-22 MED ORDER — MIDAZOLAM HCL 5 MG/5ML IJ SOLN
INTRAMUSCULAR | Status: DC | PRN
  Administered 2012-10-22: 2 mg via INTRAVENOUS
  Administered 2012-10-22: 1 mg via INTRAVENOUS
  Administered 2012-10-22: 2 mg via INTRAVENOUS
  Administered 2012-10-22: 1 mg via INTRAVENOUS
  Administered 2012-10-22 (×2): 2 mg via INTRAVENOUS

## 2012-10-22 MED ORDER — MIDAZOLAM HCL 5 MG/5ML IJ SOLN
INTRAMUSCULAR | Status: AC
  Filled 2012-10-22: qty 10

## 2012-10-22 MED ORDER — STERILE WATER FOR IRRIGATION IR SOLN
Status: DC | PRN
  Administered 2012-10-22: 09:00:00

## 2012-10-22 MED ORDER — ONDANSETRON HCL 4 MG/2ML IJ SOLN
INTRAMUSCULAR | Status: DC | PRN
  Administered 2012-10-22: 4 mg via INTRAVENOUS

## 2012-10-22 MED ORDER — MEPERIDINE HCL 100 MG/ML IJ SOLN
INTRAMUSCULAR | Status: DC | PRN
  Administered 2012-10-22 (×2): 50 mg via INTRAVENOUS

## 2012-10-22 NOTE — H&P (View-Only) (Signed)
Primary Care Physician:  Dr. Fagan Primary Gastroenterologist:  Dr. Rourk   Chief Complaint  Patient presents with  . Colonoscopy    HPI:   51-year-old pleasant male with family history of colon polyps and second-degree relative with hx of colon cancer, presenting today for routine updated colonoscopy. He has no first-degree relatives with colon cancer. His last TCS was in 2009 by Dr. Rourk, with a friable anal canal, left-sided diverticula. He also has a hx of fatty liver. Last US on file in remote past. He notes he recently had blood work done at PCP and was told his LFTs were normal. Denies abdominal pain, no change in bowel habits. No rectal bleeding. No loss of appetite, N/V. Working on losing weight, intentionally, through diet and exercise. Intermittent reflux but will take Nexium prn.   Past Medical History  Diagnosis Date  . Diabetes mellitus   . Hypertension   . GERD (gastroesophageal reflux disease)   . Fatty liver     Past Surgical History  Procedure Date  . Ileocolonoscopy 10/02/2007    RMR: Friable anal canal, otherwise normal rectum.  Left-sided diverticula.  The colonic mucosa and  terminal ileum mucosa appeared normal rectum, left-sided diverticula  . Plantar fasciitis   . Ankle surgery     bilateral  . Knee surgery     bilateral  . Shoulder surgery     bilateral    Current Outpatient Prescriptions  Medication Sig Dispense Refill  . cetirizine (ZYRTEC) 10 MG chewable tablet Chew 10 mg by mouth daily.      . dicyclomine (BENTYL) 10 MG capsule Take 10 mg by mouth as needed.      . esomeprazole (NEXIUM) 40 MG capsule Take 40 mg by mouth daily before breakfast.      . gabapentin (NEURONTIN) 600 MG tablet Take 600 mg by mouth daily.      . glipiZIDE (GLUCOTROL) 10 MG tablet Take 10 mg by mouth daily.      . Liraglutide (VICTOZA [place]) Inject 1.2 mg into the skin.      . lisinopril (PRINIVIL,ZESTRIL) 10 MG tablet Take 10 mg by mouth daily.      . metFORMIN  (GLUCOPHAGE) 1000 MG tablet Take 2,000 mg by mouth daily with breakfast.      . Sod Picosulfate-Mag Ox-Cit Acd 10-3.5-12 MG-GM-GM PACK Take 1 Container by mouth as directed.  1 each  0    Allergies as of 10/07/2012  . (No Known Allergies)    Family History  Problem Relation Age of Onset  . Colon cancer Maternal Grandmother   . Colon polyps Mother     History   Social History  . Marital Status: Married    Spouse Name: N/A    Number of Children: N/A  . Years of Education: N/A   Occupational History  . [employer]   Social History Main Topics  . Smoking status: Never Smoker   . Smokeless tobacco: Not on file  . Alcohol Use: No  . Drug Use: No  . Sexually Active: Not on file   Other Topics Concern  . Not on file   Social History Narrative  . No narrative on file    Review of Systems: Gen: Denies any fever, chills, fatigue, weight loss, lack of appetite.  CV: Denies chest pain, heart palpitations, peripheral edema, syncope.  Resp: Denies shortness of breath at rest or with exertion. Denies wheezing or cough.  GI: Denies dysphagia or odynophagia. Denies jaundice, hematemesis,   fecal incontinence. GU : Denies urinary burning, urinary frequency, urinary hesitancy MS: +joint pain, arthritis in back Derm: Denies rash, itching, dry skin Psych: Denies depression, anxiety, memory loss, and confusion Heme: Denies bruising, bleeding, and enlarged lymph nodes.  Physical Exam: BP 131/70  Pulse 88  Temp 98.4 F (36.9 C) (Oral)  Ht 6' (1.829 m)  Wt 261 lb (118.389 kg)  BMI 35.40 kg/m2 General:   Alert and oriented. Pleasant and cooperative. Well-nourished and well-developed.  Head:  Normocephalic and atraumatic. Eyes:  Without icterus, sclera clear and conjunctiva pink.  Ears:  Normal auditory acuity. Nose:  No deformity, discharge,  or lesions. Mouth:  No deformity or lesions, oral mucosa pink.  Neck:  Supple, without mass or thyromegaly. Lungs:  Clear to  auscultation bilaterally. No wheezes, rales, or rhonchi. No distress.  Heart:  S1, S2 present without murmurs appreciated.  Abdomen:  +BS, soft, non-tender and non-distended. No HSM noted. No guarding or rebound. No masses appreciated.  Rectal:  Deferred  Msk:  Symmetrical without gross deformities. Normal posture. Extremities:  Without clubbing or edema. Neurologic:  Alert and  oriented x4;  grossly normal neurologically. Skin:  Intact without significant lesions or rashes. Cervical Nodes:  No significant cervical adenopathy. Psych:  Alert and cooperative. Normal mood and affect.    

## 2012-10-22 NOTE — Op Note (Signed)
[facility] [address] [place], [address]   COLONOSCOPY PROCEDURE REPORT  PATIENT: Steve, Mccormick  MR#:         [id number] BIRTHDATE: Oct 10, 1960 , 51  yrs. old GENDER: Male ENDOSCOPIST: R.  Roetta Sessions, MD FACP FACG REFERRED BY:  Carylon Perches, M.D. PROCEDURE DATE:  10/22/2012 PROCEDURE:     Screening colonoscopy  INDICATIONS: Positive family history colon polyps and cancer  INFORMED CONSENT:  The risks, benefits, alternatives and imponderables including but not limited to bleeding, perforation as well as the possibility of a missed lesion have been reviewed.  The potential for biopsy, lesion removal, etc. have also been discussed.  Questions have been answered.  All parties agreeable. Please see the history and physical in the medical record for more information.  MEDICATIONS: Versed 10 mg IV in Demerol 100 mg IV in divided doses. Zofran 4 mg IV  DESCRIPTION OF PROCEDURE:  After a digital rectal exam was performed, the Pentax Colonoscope [id number]  colonoscope was advanced from the anus through the rectum and colon to the area of the cecum, ileocecal valve and appendiceal orifice.  The cecum was deeply intubated.  These structures were well-seen and photographed for the record.  From the level of the cecum and ileocecal valve, the scope was slowly and cautiously withdrawn.  The mucosal surfaces were carefully surveyed utilizing scope tip deflection to facilitate fold flattening as needed.  The scope was pulled down into the rectum where a thorough examination including retroflexion was performed.    FINDINGS:  Adequate preparation. Normal rectum. Sigmoid diverticula; the remainder of the colonic mucosa appeared normal.  THERAPEUTIC / DIAGNOSTIC MANEUVERS PERFORMED:  None  COMPLICATIONS: none  CECAL WITHDRAWAL TIME:  11 minutes  IMPRESSION:  Colonic diverticulosis  RECOMMENDATIONS: Repeat colonoscopy in 5  years   _______________________________ eSigned:  R. Roetta Sessions, MD FACP [facility] 10/22/2012 9:18 AM   CC:

## 2012-10-22 NOTE — Interval H&P Note (Signed)
History and Physical Interval Note:  10/22/2012 8:32 AM  Steve Mccormick  has presented today for surgery, with the diagnosis of SCREENING  The various methods of treatment have been discussed with the patient and family. After consideration of risks, benefits and other options for treatment, the patient has consented to  Procedure(s) with comments: COLONOSCOPY (N/A) - 8:30AM as a surgical intervention .  The patient's history has been reviewed, patient examined, no change in status, stable for surgery.  I have reviewed the patient's chart and labs.  Questions were answered to the patient's satisfaction.     [name]  Colonoscopy per plan. Primary Care Physician:  Carylon Perches, MD Primary Gastroenterologist:  Dr.  Donn Pierini risks, benefits, limitations, alternatives and imponderables have been reviewed with the patient. Questions have been answered. All parties are agreeable.

## 2012-10-27 ENCOUNTER — Encounter (HOSPITAL_COMMUNITY): Payer: Self-pay | Admitting: Internal Medicine

## 2013-06-17 ENCOUNTER — Ambulatory Visit
Admission: RE | Admit: 2013-06-17 | Discharge: 2013-06-17 | Disposition: A | Discharge status: Home / Self Care | Payer: PRIVATE HEALTH INSURANCE | Source: Ambulatory Visit | Attending: Pain Medicine | Admitting: Pain Medicine

## 2013-06-17 ENCOUNTER — Other Ambulatory Visit: Payer: Self-pay | Admitting: Pain Medicine

## 2013-06-17 DIAGNOSIS — M25552 Pain in left hip: Secondary | ICD-10-CM

## 2013-06-17 DIAGNOSIS — M25551 Pain in right hip: Secondary | ICD-10-CM

## 2014-01-06 ENCOUNTER — Other Ambulatory Visit: Payer: Self-pay | Admitting: Pain Medicine

## 2014-01-06 DIAGNOSIS — M542 Cervicalgia: Secondary | ICD-10-CM

## 2014-01-08 ENCOUNTER — Other Ambulatory Visit: Payer: Self-pay | Admitting: *Deleted

## 2014-01-08 DIAGNOSIS — G5790 Unspecified mononeuropathy of unspecified lower limb: Secondary | ICD-10-CM

## 2014-01-09 ENCOUNTER — Ambulatory Visit
Admission: RE | Admit: 2014-01-09 | Discharge: 2014-01-09 | Disposition: A | Discharge status: Home / Self Care | Payer: PRIVATE HEALTH INSURANCE | Source: Ambulatory Visit | Attending: Pain Medicine | Admitting: Pain Medicine

## 2014-01-09 DIAGNOSIS — M542 Cervicalgia: Secondary | ICD-10-CM

## 2014-01-20 ENCOUNTER — Telehealth: Payer: Self-pay | Admitting: Gastroenterology

## 2014-01-20 DIAGNOSIS — K760 Fatty (change of) liver, not elsewhere classified: Secondary | ICD-10-CM

## 2014-01-20 NOTE — Telephone Encounter (Signed)
Chelsey, will you get a copy of the labs please.

## 2014-01-20 NOTE — Telephone Encounter (Signed)
Hx of fatty liver.  IF he has had recent LFTs through PCP, please request. If not, we need a set of LFTs on file for our records

## 2014-01-25 NOTE — Telephone Encounter (Signed)
Records are in your cart.  

## 2014-01-25 NOTE — Telephone Encounter (Signed)
Requested labs

## 2014-02-08 ENCOUNTER — Other Ambulatory Visit: Payer: Self-pay | Admitting: Pain Medicine

## 2014-02-08 ENCOUNTER — Ambulatory Visit
Admission: RE | Admit: 2014-02-08 | Discharge: 2014-02-08 | Disposition: A | Discharge status: Home / Self Care | Payer: PRIVATE HEALTH INSURANCE | Source: Ambulatory Visit | Attending: Pain Medicine | Admitting: Pain Medicine

## 2014-02-08 DIAGNOSIS — M549 Dorsalgia, unspecified: Secondary | ICD-10-CM

## 2014-02-11 ENCOUNTER — Encounter: Payer: Self-pay | Admitting: Vascular Surgery

## 2014-02-12 ENCOUNTER — Encounter: Payer: Self-pay | Admitting: Vascular Surgery

## 2014-02-12 ENCOUNTER — Other Ambulatory Visit: Payer: Self-pay | Admitting: Vascular Surgery

## 2014-02-12 ENCOUNTER — Ambulatory Visit (INDEPENDENT_AMBULATORY_CARE_PROVIDER_SITE_OTHER): Payer: PRIVATE HEALTH INSURANCE | Admitting: Vascular Surgery

## 2014-02-12 ENCOUNTER — Ambulatory Visit (HOSPITAL_COMMUNITY)
Admission: RE | Admit: 2014-02-12 | Discharge: 2014-02-12 | Disposition: A | Discharge status: Home / Self Care | Payer: PRIVATE HEALTH INSURANCE | Source: Ambulatory Visit | Attending: Vascular Surgery | Admitting: Vascular Surgery

## 2014-02-12 ENCOUNTER — Encounter (INDEPENDENT_AMBULATORY_CARE_PROVIDER_SITE_OTHER): Payer: Self-pay

## 2014-02-12 VITALS — BP 119/78 | HR 85 | Ht 72.0 in | Wt 244.4 lb

## 2014-02-12 DIAGNOSIS — G5690 Unspecified mononeuropathy of unspecified upper limb: Secondary | ICD-10-CM

## 2014-02-12 DIAGNOSIS — I7300 Raynaud's syndrome without gangrene: Secondary | ICD-10-CM

## 2014-02-12 DIAGNOSIS — M069 Rheumatoid arthritis, unspecified: Secondary | ICD-10-CM

## 2014-02-12 DIAGNOSIS — G5790 Unspecified mononeuropathy of unspecified lower limb: Secondary | ICD-10-CM

## 2014-02-12 NOTE — Progress Notes (Signed)
Referred by:  Carylon Perches, MD [address] [place], [place] [address]  Reason for referral: Rule out arterial disease.   History of Present Illness  Steve Mccormick is a 53 y.o. (08-Nov-1960) male who presents for evaluation for of bilateral index finger discoloration and cold sensitivity. He has a past medical history of rheumatoid arthritis and osteoarthritis. He was referred here by his rheumatologist for evaluation of possible arterial disease. He describes his index fingers as turning "pale" and become painful in response to cold temperatures. These symptoms started occuring eight months ago. The symptoms are relieved with heat. He notes that sometimes the symptoms may last for an entire day. He denies a history of non-healing wounds on his hands and feet and denies intermittent claudication.   He has diabetes mellitus treated with metformin and glipizide. He has hypertension treated with an ace inhibitor.   Past Medical History  Diagnosis Date  . Diabetes mellitus   . Hypertension   . GERD (gastroesophageal reflux disease)   . Fatty liver     Past Surgical History  Procedure Laterality Date  . Ileocolonoscopy  10/02/2007    RMR: Friable anal canal, otherwise normal rectum.  Left-sided diverticula.  The colonic mucosa and  terminal ileum mucosa appeared normal rectum, left-sided diverticula  . Plantar fasciitis    . Ankle surgery      bilateral  . Knee surgery      bilateral  . Shoulder surgery      bilateral  . Colonoscopy N/A 10/22/2012    Procedure: COLONOSCOPY;  Surgeon: Corbin Ade, MD;  Location: [facility];  Service: Endoscopy;  Laterality: N/A;  8:30AM    History   Social History  . Marital Status: Married    Spouse Name: N/A    Number of Children: N/A  . Years of Education: N/A   Occupational History  . [employer]   Social History Main Topics  . Smoking status: Never Smoker   . Smokeless tobacco: Not on file  .  Alcohol Use: No  . Drug Use: No  . Sexual Activity: Not on file   Other Topics Concern  . Not on file   Social History Narrative  . No narrative on file    Family History  Problem Relation Age of Onset  . Colon cancer Maternal Grandmother   . Colon polyps Mother   . Diabetes Mother   . Hypertension Mother   . Hyperlipidemia Mother   . Diabetes Father     Current Outpatient Prescriptions on File Prior to Visit  Medication Sig Dispense Refill  . cetirizine (ZYRTEC) 10 MG tablet Take 10 mg by mouth daily as needed. Allergies      . esomeprazole (NEXIUM) 40 MG capsule Take 40 mg by mouth daily as needed. Acid Reflux      . glipiZIDE (GLUCOTROL) 10 MG tablet Take 10 mg by mouth daily.      . Liraglutide (VICTOZA [place]) Inject 1.2 mg into the skin.      Marland Kitchen lisinopril (PRINIVIL,ZESTRIL) 10 MG tablet Take 10 mg by mouth daily.      . metFORMIN (GLUCOPHAGE) 1000 MG tablet Take 2,000 mg by mouth 2 (two) times daily.       Marland Kitchen dicyclomine (BENTYL) 10 MG capsule Take 10 mg by mouth as needed. Stomach Cramps      . gabapentin (NEURONTIN) 600 MG tablet Take 600 mg by mouth daily.      Marland Kitchen  ibuprofen (ADVIL,MOTRIN) 200 MG tablet Take 200 mg by mouth every 6 (six) hours as needed. Pain      . Sod Picosulfate-Mag Ox-Cit Acd 10-3.5-12 MG-GM-GM PACK Take 1 Container by mouth as directed.  1 each  0   No current facility-administered medications on file prior to visit.    Allergies  Allergen Reactions  . Fish Allergy Anaphylaxis, Swelling and Other (See Comments)    Shaky   . Percocet [Oxycodone-Acetaminophen] Itching    REVIEW OF SYSTEMS:  (Positives checked otherwise negative)  CARDIOVASCULAR:  []  chest pain, []  chest pressure, []  palpitations, []  shortness of breath when laying flat, []  shortness of breath with exertion,  []  pain in feet when walking, []  pain in feet when laying flat, []  history of blood clot in veins (DVT), []  history of phlebitis, []  swelling in legs, []  varicose  veins  PULMONARY:  []  productive cough, []  asthma, []  wheezing  NEUROLOGIC:  []  weakness in arms or legs, []  numbness in arms or legs, []  difficulty speaking or slurred speech, []  temporary loss of vision in one eye, []  dizziness  HEMATOLOGIC:  []  bleeding problems, []  problems with blood clotting too easily  MUSCULOSKEL:  [x]  joint pain, []  joint swelling  GASTROINTEST:  []  vomiting blood, []  blood in stool     GENITOURINARY:  []  burning with urination, []  blood in urine  PSYCHIATRIC:  []  history of major depression  INTEGUMENTARY:  []  rashes, []  ulcers  CONSTITUTIONAL:  []  fever, []  chills  Physical Examination  Filed Vitals:   02/12/14 1401  BP: 119/78  Pulse: 85  Height: 6' (1.829 m)  Weight: 244 lb 6.4 oz (110.859 kg)  SpO2: 99%   Body mass index is 33.14 kg/(m^2).  General: A&O x 3, Obese male in NAD  Head: [place]/AT  Ear/Nose/Throat: Hearing grossly intact  Eyes: PERRLA, EOMI  Pulmonary: Sym exp, good air movt   Cardiac: RRR, Nl S1, S2, no Murmurs, rubs or gallops  Vascular: Vessel Right Left  Radial Palpable Palpable  Ulnar Palpable Palpable  Brachial Palpable Palpable    Musculoskeletal: M/S 5/5 throughout, Extremities without ischemic changes   Neurologic: CN 2-12 intact. Pain and light touch intact in extremities.  Motor exam as listed above  Psychiatric: Judgment intact, Mood & affect appropriate for pt's clinical situation,  Dermatologic: See M/S exam for extremity exam, no rashes otherwise noted  Lymph: no palpable LAD  Non-Invasive Vascular Imaging Upper Extremity Doppler Study R Wrist/Brachial Index 1.27 L Wrist/Brachial Index 1.25 -Bilateral upper extremity doppler waveforms and pressures are within normal limits at rest.  -Thoracic outlet maneuvers were performed and within normal limits.    Medical Decision Making  LEONIDUS ROWAND is a 53 y.o. male who presents with Raynaud's syndrome, known Rheumatoid arthritis  Do not  suspect any arterial disease. Normal doppler studies.   Discussed use of calcium channel blocker for peripheral vasodilation to alleviate symptoms. Mr. Coulthard is currently on an ace inhibitor for hypertension. Suspect use of an ace inhibitor for renal protection. Will defer change of antihypertensive medication to primary care provider.   Discussed use of topical nitroglycerin for relief of symptoms.   Advised patient if symptoms of prolonged ischemia occur to return for follow up.   , PA-C [facility] Office: [phone] Pager: [phone]  02/12/2014, 2:39 PM  Addendum  I have independently interviewed and examined the patient, and I agree with the physician assistant's findings.  Pt currently is not presenting  with his Raynaud's.  There is limited guidance in regards to long-term prophylaxis for such.  Pharmacologic therapy seems to be centered around calcium channel blockade and nitro-paste to finger prn.  I will defer to Dr. Ouida Sills possibly changing his anti-HTN agent to Norvasc to possibly prophylactic manage his Raynaud and also treat his HTN.  Avoidance of the known trigger in this patient, cold, is the primary prophylactic maneuver.    Leonides Sake, MD [facility] Office: [phone] Pager: [phone]  02/12/2014, 4:59 PM

## 2014-02-17 NOTE — Telephone Encounter (Signed)
The only labs I received are an A1c.  Needs set of LFTs for our records.

## 2014-02-26 ENCOUNTER — Other Ambulatory Visit: Payer: Self-pay

## 2014-02-26 DIAGNOSIS — K760 Fatty (change of) liver, not elsewhere classified: Secondary | ICD-10-CM

## 2014-02-26 NOTE — Telephone Encounter (Signed)
Letter and lab orders have been mailed to the pt.

## 2014-03-31 ENCOUNTER — Other Ambulatory Visit: Payer: Self-pay | Admitting: Pain Medicine

## 2014-03-31 DIAGNOSIS — M5137 Other intervertebral disc degeneration, lumbosacral region: Secondary | ICD-10-CM

## 2014-04-06 ENCOUNTER — Other Ambulatory Visit: Payer: PRIVATE HEALTH INSURANCE

## 2014-04-09 ENCOUNTER — Ambulatory Visit
Admission: RE | Admit: 2014-04-09 | Discharge: 2014-04-09 | Disposition: A | Discharge status: Home / Self Care | Payer: PRIVATE HEALTH INSURANCE | Source: Ambulatory Visit | Attending: Pain Medicine | Admitting: Pain Medicine

## 2014-04-09 DIAGNOSIS — M5137 Other intervertebral disc degeneration, lumbosacral region: Secondary | ICD-10-CM

## 2014-05-27 ENCOUNTER — Ambulatory Visit (HOSPITAL_COMMUNITY)
Admission: RE | Admit: 2014-05-27 | Discharge: 2014-05-27 | Disposition: A | Discharge status: Home / Self Care | Payer: PRIVATE HEALTH INSURANCE | Source: Ambulatory Visit | Attending: Pain Medicine | Admitting: Pain Medicine

## 2014-05-27 DIAGNOSIS — M545 Low back pain, unspecified: Secondary | ICD-10-CM

## 2014-05-27 DIAGNOSIS — M542 Cervicalgia: Secondary | ICD-10-CM

## 2014-05-27 DIAGNOSIS — IMO0001 Reserved for inherently not codable concepts without codable children: Secondary | ICD-10-CM | POA: Insufficient documentation

## 2014-05-27 DIAGNOSIS — M2560 Stiffness of unspecified joint, not elsewhere classified: Secondary | ICD-10-CM

## 2014-05-27 DIAGNOSIS — M6281 Muscle weakness (generalized): Secondary | ICD-10-CM

## 2014-05-27 DIAGNOSIS — M546 Pain in thoracic spine: Secondary | ICD-10-CM

## 2014-05-27 DIAGNOSIS — E119 Type 2 diabetes mellitus without complications: Secondary | ICD-10-CM | POA: Insufficient documentation

## 2014-05-27 DIAGNOSIS — I10 Essential (primary) hypertension: Secondary | ICD-10-CM | POA: Diagnosis not present

## 2014-05-27 DIAGNOSIS — R269 Unspecified abnormalities of gait and mobility: Secondary | ICD-10-CM | POA: Insufficient documentation

## 2014-05-27 DIAGNOSIS — M25659 Stiffness of unspecified hip, not elsewhere classified: Secondary | ICD-10-CM

## 2014-05-27 NOTE — Evaluation (Signed)
Physical Therapy Evaluation  Patient Details  Name: Steve Mccormick MRN: [id number] Date of Birth: February 15, 1961  Today's Date: 05/27/2014 Time: 1655-1730 PT Time Calculation (min): 35 min     Charges: 1 Evaluation, TE 1720-1730         Visit#: 1 of 6  Re-eval: 06/17/14 Assessment Diagnosis: cervicalgia, lumbago and thoracic lumbar spine Next MD Visit: Ardell Isaacs: 07/15/14 Prior Therapy: no  Authorization: [employer]     Past Medical History:  Past Medical History  Diagnosis Date  . Diabetes mellitus   . Hypertension   . GERD (gastroesophageal reflux disease)   . Fatty liver    Past Surgical History:  Past Surgical History  Procedure Laterality Date  . Ileocolonoscopy  10/02/2007    RMR: Friable anal canal, otherwise normal rectum.  Left-sided diverticula.  The colonic mucosa and  terminal ileum mucosa appeared normal rectum, left-sided diverticula  . Plantar fasciitis    . Ankle surgery      bilateral  . Knee surgery      bilateral  . Shoulder surgery      bilateral  . Colonoscopy N/A 10/22/2012    Procedure: COLONOSCOPY;  Surgeon: Corbin Ade, MD;  Location: [facility];  Service: Endoscopy;  Laterality: N/A;  8:30AM    Subjective Symptoms/Limitations Symptoms: Pain in mid thoracic spine, uppr lumbar spine and lower lumbar spine.  Pertinent History: patient has a long history of neck and back pain secondary to arthritis. MRI shows DDD, arthritics, bulging discs, seldum radiatign pain into LE, minor pain in hips,  How long can you sit comfortably?: <1 hour How long can you stand comfortably?: < 1 hour How long can you walk comfortably?: <55minutes Repetition: Increases Symptoms Patient Stated Goals: decrease, avoid any surgeries,  Pain Assessment Currently in Pain?: Yes Pain Score: 2  Pain Location: Back (back and nec) Pain Orientation: Right Pain Type: Chronic pain Pain Radiating Towards: ver seldom down leg. Pain Onset: More than a month ago Pain  Frequency: Constant Pain Relieving Factors: sitting in recliner Effect of Pain on Daily Activities: More pain with stooping forward, with working, with lifting  Cognition/Observation Observation/Other Assessments Observations: Gait: limiited toe out, excessive toe out, excessive pes cavs, limiited calcaneal inversion, limited tibial intermla rotation,  Other Assessments: FOTO 59% limited  Sensation/Coordination/Flexibility/Functional Tests Flexibility Thomas: Positive 90/90: Positive  Assessment RLE AROM (degrees) Right Ankle Dorsiflexion: 10 RLE Strength Right Hip Flexion: 5/5 Right Hip External Rotation : 40 Right Hip Internal Rotation : 2 Right Hip ABduction: 4/5 Right Knee Flexion: 4/5 Right Knee Extension: 5/5 LLE AROM (degrees) Left Hip External Rotation : 30 Left Hip Internal Rotation : 20 Left Ankle Dorsiflexion: 5 LLE Strength Left Hip Flexion: 5/5 Left Knee Flexion: 4/5 Left Knee Extension: 5/5 Cervical AROM Overall Cervical AROM Comments: Thoracic spine screened: limited mobility throughout with extension 50% limited Cervical Flexion: 55 Cervical Extension: 37 Cervical - Right Side Bend: painful Cervical - Left Side Bend: painful Cervical - Right Rotation: 57 Cervical - Left Rotation: 60 Cervical Strength Overall Cervical Strength Comments: to be assessed at later date Lumbar AROM Overall Lumbar AROM Comments: Sidebends Lt>Rt Lumbar Flexion: 85 patine throughtou with repeated ROM Lumbar Extension: 20 pain at L2 with repeated Lumbar - Right Rotation: 50% Lumbar - Left Rotation: 90% Lumbar Strength Overall Lumbar Strength Comments: to be assessed at later date Palpation Palpation: Pain with L4, L5, L1, T12-T8, T4,5, Limited C5,6 up and down glides  Exercise/Treatments hip flexor, piriforis, groin, calf stretch 2x 15  seconds each  3D hip excursion 10x  3D neck excursion 10x  Physical Therapy Assessment and Plan PT Assessment and Plan Clinical  Impression Statement: Patient displays Thoracic > Lumbar > cervical spine pain secondary to limited hip mobility (limited internal rotation, extension) limited ankle dorsiflexion resulting in decreased ability to decelerate landing during gait as well as limited lower cervical and thoracic mobility. 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  Pt will benefit from skilled therapeutic intervention in order to improve on the following deficits: Abnormal gait;Decreased activity tolerance;Decreased strength;Pain;Impaired flexibility;Improper body mechanics;Increased fascial restricitons;Decreased range of motion Rehab Potential: Good PT Frequency: Min 2X/week PT Duration: 8 weeks PT Treatment/Interventions: Gait training;Stair training;Functional mobility training;Therapeutic activities;Therapeutic exercise;Modalities;Manual techniques;Patient/family education PT Plan: Initial focus on increasing cervical, lumbar and thoracic spine mobility, as well as increasing hip extension and internal rotation and ankle dorsiflexion, Strength to be assessed at a later date. Introduce thoracic spine matrix    Goals Home Exercise Program Pt/caregiver will Perform Home Exercise Program: For increased ROM;For increased strengthening PT Goal: Perform Home Exercise Program - Progress: Goal set today PT Short Term Goals Time to Complete Short Term Goals: 4 weeks PT Short Term Goal 1: Patient will demosntrate increased ankle dorsiflexion to > 11 degrees bilaterally to decrease early heel off during gait PT Short Term Goal 2: Patient will dmeosntrate increased hip internal rotation bilaterally to >15 degrees to improve shock absorbtion mechanics during gait PT Short Term Goal 3: Patient demsontrate increased Lumbar extension to >30 degrees PT Short Term Goal 4: Patient demsontrate increased Thoracic  extension to WNL PT Short Term Goal 5: Patient will be able to rotate head Lt and Rt >70 degrees PT Long Term Goals Time to Complete Long Term Goals: 8 weeks PT Long Term Goal 1: Patient will dmeosntrate increased hip internal rotation bilaterally to >30 degrees to improve shock absorbtion mechanics during gait PT Long Term Goal 2: Patient will demosntrate increased glut max/med strength to > 4/5 MMT  Problem List Patient Active Problem List   Diagnosis Date Noted  . Stiffness of joint, not elsewhere classified, pelvic region and thigh 05/27/2014  . Stiffness of joints, not elsewhere classified, multiple sites 05/27/2014  . Lumbago 05/27/2014  . Cervicalgia 05/27/2014  . Pain in thoracic spine 05/27/2014  . Muscle weakness (generalized) 05/27/2014  . Raynaud's syndrome 02/12/2014  . Rheumatoid arthritis 02/12/2014  . Screening 10/08/2012  . Fatty liver 10/08/2012    PT - End of Session Activity Tolerance: Patient tolerated treatment well General Behavior During Therapy: WFL for tasks assessed/performed PT Plan of Care PT Home Exercise Plan: hip flexor, piriforis, groin, calf stretch 2x 15 seconds, 3D hip excursion, 3D neck excursion.   GP    Zissy Hamlett R 05/27/2014, 7:38 PM  Physician Documentation Your signature is required to indicate approval of the treatment plan as stated above.  Please sign and either send electronically or make a copy of this report for your files and return this physician signed original.   Please mark one 1.__approve of plan  2. ___approve of plan with the following conditions.   ______________________________  _____________________ Physician Signature                                                                                                             Date

## 2014-05-28 ENCOUNTER — Ambulatory Visit (HOSPITAL_COMMUNITY)
Admission: RE | Admit: 2014-05-28 | Discharge: 2014-05-28 | Disposition: A | Discharge status: Home / Self Care | Payer: PRIVATE HEALTH INSURANCE | Source: Ambulatory Visit | Attending: Internal Medicine | Admitting: Internal Medicine

## 2014-05-28 DIAGNOSIS — IMO0001 Reserved for inherently not codable concepts without codable children: Secondary | ICD-10-CM | POA: Diagnosis not present

## 2014-05-28 NOTE — Progress Notes (Signed)
Physical Therapy Treatment Patient Details  Name: Steve Mccormick MRN: [id number] Date of Birth: 03-30-1961  Today's Date: 05/28/2014 Time: 6160-7371 PT Time Calculation (min): 40 min 19' TE 15' manual  Visit#: 2 of 6  Re-eval: 06/17/14    Authorization: [employer]  Authorization Time Period:    Authorization Visit#:   of     Subjective: Symptoms/Limitations Symptoms: 5/10 pain in mid thoracic spine; no neck at present but earlier this morning 8/10 Pain Assessment Currently in Pain?: Yes Pain Score: 5  Pain Location: Neck Pain Orientation: Mid      Exercise/Treatments      Stretches Upper Trapezius Stretch: 3 reps;30 seconds Levator Stretch: 3 reps;30 seconds Lower Cervical/Upper Thoracic Stretch: 5 reps;10 seconds;Limitations (hands clasped anteriorly, rounding thoracic area) Seated Exercises Cervical Isometrics: 5 secs;5 reps;Extension Neck Retraction: 10 reps;3 secs       Stretches Active Hamstring Stretch: 3 reps;30 seconds;Limitations (3 way 14" box) Hip Flexor Stretch: 2 reps;30 seconds;Limitations (14" box)      Manual Therapy Manual Therapy: Massage Massage: STM to B cervical/trap area and lower R erector spinae to reduce tightness Myofascial Release: MFR to B medial scapulae  R more restrictions then left to reduce tightness/pain and increase mobility  Physical Therapy Assessment and Plan PT Assessment and Plan Clinical Impression Statement: Patient did not have c/o of any hip or lumbar pain today, however 5/10 thoracic spine pain and 8/10 cervical pain earlier in the day. Began stretches of cervical (isometrics as well), thoracic and lumbar spine and manual with STM and MFR to B scapulae, cervical area and R lower thoracic erector spinae. Patient reports decrease in pain of thoracic area after treatment today. Pt will benefit from skilled therapeutic intervention in order to improve on the following deficits: Decreased knowledge of precautions PT  Plan: Initial focus on increasing cervical, lumbar and thoracic spine mobility, as well as increasing hip extension and internal rotation and ankle dorsiflexion, Strength to be assessed at a later date. Introduce thoracic spine matrix    Goals    Problem List Patient Active Problem List   Diagnosis Date Noted  . Stiffness of joint, not elsewhere classified, pelvic region and thigh 05/27/2014  . Stiffness of joints, not elsewhere classified, multiple sites 05/27/2014  . Lumbago 05/27/2014  . Cervicalgia 05/27/2014  . Pain in thoracic spine 05/27/2014  . Muscle weakness (generalized) 05/27/2014  . Raynaud's syndrome 02/12/2014  . Rheumatoid arthritis 02/12/2014  . Screening 10/08/2012  . Fatty liver 10/08/2012    PT - End of Session Activity Tolerance: Patient tolerated treatment well General Behavior During Therapy: [facility] for tasks assessed/performed  GP    Audrey Eller ATKINSO 05/28/2014, 3:57 PM

## 2014-05-31 ENCOUNTER — Ambulatory Visit (HOSPITAL_COMMUNITY)
Admission: RE | Admit: 2014-05-31 | Discharge: 2014-05-31 | Disposition: A | Discharge status: Home / Self Care | Payer: PRIVATE HEALTH INSURANCE | Source: Ambulatory Visit | Attending: Internal Medicine | Admitting: Internal Medicine

## 2014-05-31 DIAGNOSIS — IMO0001 Reserved for inherently not codable concepts without codable children: Secondary | ICD-10-CM | POA: Diagnosis not present

## 2014-05-31 NOTE — Progress Notes (Signed)
Physical Therapy Treatment Patient Details  Name: Steve Mccormick MRN: [id number] Date of Birth: 1961-04-04  Today's Date: 05/31/2014 Time: 7371-0626 PT Time Calculation (min): 45 min TE 9485-4627, Estime/Ice 0350-0938  Visit#: 3 of 6  Re-eval: 06/17/14 Assessment Diagnosis: cervicalgia, lumbago and thoracic lumbar spine Next MD Visit: Ardell Isaacs: 07/15/14   Subjective: Symptoms/Limitations Symptoms: Pt reports he has been completing his HEP, though this has been causing some sharp pains.   Pain Assessment Pain Score: 6  Pain Location: Thoracic Pain Orientation: Right Pain Type: Chronic pain Multiple Pain Sites: Yes  Precautions/Restrictions     Exercise/Treatments Stretches Upper Trapezius Stretch: 3 reps;30 seconds Levator Stretch: 3 reps;30 seconds Seated Exercises Neck Retraction: 10 reps;3 secs Other Seated Exercise: 3D Thoracic Excursion with arms crossed x5 Supine Exercises Other Supine Exercise: Lat Slides x10 Stretches Active Hamstring Stretch: 3 reps;30 seconds;Limitations Active Hamstring Stretch Limitations: 14" Box Single Knee to Chest Stretch: 2 reps;20 seconds Piriformis Stretch: 3 reps;20 seconds;Limitations Piriformis Stretch Limitations: Seated Standing Other Standing Lumbar Exercises: Theme park manager, Slantboard 30" x3  Modalities Modalities: Electrical Stimulation;Cryotherapy Cryotherapy Number Minutes Cryotherapy: 5 Minutes Cryotherapy Location: Diplomatic Services operational officer Stimulation Location: thoracic spine Electrical Stimulation Parameters: IFC  Electrical Stimulation Goals: Pain  Physical Therapy Assessment and Plan PT Assessment and Plan Clinical Impression Statement: Pt reports complaints of pain from cervical spine down to lumbar spine, though most pain in cervical/thoracic region.  Pt was able to complete stretches, though some pain increases reported with upper trapezius and lumber rotation; educated pt to  decrease pull on upper trap and hold time on lumbar rotation to decrease pain.  Pt reports he has been using his traction unit on his neck, inversion table and TENS unit; pt reports he has only been using his TENS unit on his low back as he was unsure how to use it on his cervical/thoracic region.  Ice/estim applied in supine at end of treatment for 5 minutes to educated pt on techniques to continue at home.   Pt will benefit from skilled therapeutic intervention in order to improve on the following deficits: Decreased knowledge of precautions;Decreased activity tolerance;Decreased strength;Decreased mobility;Decreased range of motion PT Frequency: Min 2X/week PT Duration: 8 weeks PT Plan: Initial focus on increasing cervical, lumbar and thoracic spine mobility.  Strength to be assessed at a later date.     Goals PT Short Term Goals PT Short Term Goal 1: Patient will demosntrate increased ankle dorsiflexion to > 11 degrees bilaterally to decrease early heel off during gait PT Short Term Goal 1 - Progress: Progressing toward goal PT Short Term Goal 4: Patient demsontrate increased Thoracic extension to WNL PT Short Term Goal 4 - Progress: Progressing toward goal  Problem List Patient Active Problem List   Diagnosis Date Noted  . Stiffness of joint, not elsewhere classified, pelvic region and thigh 05/27/2014  . Stiffness of joints, not elsewhere classified, multiple sites 05/27/2014  . Lumbago 05/27/2014  . Cervicalgia 05/27/2014  . Pain in thoracic spine 05/27/2014  . Muscle weakness (generalized) 05/27/2014  . Raynaud's syndrome 02/12/2014  . Rheumatoid arthritis 02/12/2014  . Screening 10/08/2012  . Fatty liver 10/08/2012    PT - End of Session Activity Tolerance: Patient tolerated treatment well General Behavior During Therapy: [facility] for tasks assessed/performed  Tevan Marian 05/31/2014, 4:27 PM

## 2014-06-02 ENCOUNTER — Ambulatory Visit (HOSPITAL_COMMUNITY)
Admission: RE | Admit: 2014-06-02 | Discharge: 2014-06-02 | Disposition: A | Discharge status: Home / Self Care | Payer: PRIVATE HEALTH INSURANCE | Source: Ambulatory Visit | Attending: Pain Medicine | Admitting: Pain Medicine

## 2014-06-02 DIAGNOSIS — IMO0001 Reserved for inherently not codable concepts without codable children: Secondary | ICD-10-CM | POA: Diagnosis not present

## 2014-06-02 NOTE — Progress Notes (Signed)
Physical Therapy Treatment Patient Details  Name: Steve Mccormick MRN: [id number] Date of Birth: 14-Aug-1961  Today's Date: 06/02/2014 Time: 1135-1215 PT Time Calculation (min): 40 min   Charges: Manual 1135-1150, TE 2505-3976 Visit#: 4 of 6   Authorization: [employer]   Subjective: Symptoms/Limitations Symptoms: Patient states: "I don't feel much of a difference yet and my neck is really sore today." Pain Assessment Currently in Pain?: Yes Pain Score: 5  Pain Location: Neck  Exercise/Treatments Stretches Other Neck Stretches: 3D neck excursion 10x Seated Exercises Neck Retraction: 10 reps;3 secs Other Seated Exercise: cerivical spine matrix with 2lb dowel 10x Other Seated Exercise: 3D Thoracic Excursion with overhead reach x10 Stretches Piriformis Stretch: 3 reps;20 seconds;Limitations Piriformis Stretch Limitations: Seated Standing Other Standing Lumbar Exercises: Squat walk around 10x with therapist assist for form and Rt knee stabilization.   Manual Therapy Manual Therapy: Joint mobilization Joint Mobilization: up and down glides C5, C6 Massage: STM to B cervical/trap area   Physical Therapy Assessment and Plan PT Assessment and Plan Clinical Impression Statement: Patient reports decreased pain in cervical spine following soft tissue mobilization of upper trapezius and and suboccipitals as well as increased ROM following C5.C5 up glides, noted limitation on Rt C6 up glides. Patient demosntrated good performance of all exercises but needed adaptation of cervical spine matrix to a 8" box to decrease loading on Rt LE secondary to pain with lunging. Patient also demosntrated good performance of Thoracic spine excursion but had patient with Rt rotation. Session ended with squat walk around on Rt LE to increase Rt Hip internal rotation ROM with patient noting decreased low back pain and demonstrating improved gait with ddecreased Rt LE toe out.  PT Plan: Next session perform  exact same treatment/exercises to continue improvements made in cervical spine and progress hip mobility.     Goals PT Short Term Goals PT Short Term Goal 2: Patient will dmeosntrate increased hip internal rotation bilaterally to >15 degrees to improve shock absorbtion mechanics during gait PT Short Term Goal 2 - Progress: Progressing toward goal PT Short Term Goal 3: Patient demsontrate increased Lumbar extension to >30 degrees PT Short Term Goal 3 - Progress: Progressing toward goal PT Short Term Goal 4: Patient demsontrate increased Thoracic extension to WNL PT Short Term Goal 4 - Progress: Progressing toward goal PT Short Term Goal 5: Patient will be able to rotate head Lt and Rt >70 degrees PT Short Term Goal 5 - Progress: Progressing toward goal  Problem List Patient Active Problem List   Diagnosis Date Noted  . Stiffness of joint, not elsewhere classified, pelvic region and thigh 05/27/2014  . Stiffness of joints, not elsewhere classified, multiple sites 05/27/2014  . Lumbago 05/27/2014  . Cervicalgia 05/27/2014  . Pain in thoracic spine 05/27/2014  . Muscle weakness (generalized) 05/27/2014  . Raynaud's syndrome 02/12/2014  . Rheumatoid arthritis 02/12/2014  . Screening 10/08/2012  . Fatty liver 10/08/2012    PT - End of Session Activity Tolerance: Patient tolerated treatment well General Behavior During Therapy: [facility] for tasks assessed/performed  GP    Whitney Hillegass R 06/02/2014, 12:24 PM

## 2014-06-09 ENCOUNTER — Ambulatory Visit (HOSPITAL_COMMUNITY)
Admission: RE | Admit: 2014-06-09 | Discharge: 2014-06-09 | Disposition: A | Discharge status: Home / Self Care | Payer: PRIVATE HEALTH INSURANCE | Source: Ambulatory Visit | Attending: Physical Therapy | Admitting: Physical Therapy

## 2014-06-09 DIAGNOSIS — IMO0001 Reserved for inherently not codable concepts without codable children: Secondary | ICD-10-CM | POA: Diagnosis not present

## 2014-06-09 NOTE — Progress Notes (Signed)
Physical Therapy Treatment Patient Details  Name: Steve Mccormick MRN: [id number] Date of Birth: 04/24/1961  Today's Date: 06/09/2014 Time: 3612-2449 PT Time Calculation (min): 44 min 24' TE 15' manual  Visit#: 5 of 6  Re-eval: 06/17/14    Authorization: [employer]  Authorization Time Period:    Authorization Visit#:   of     Subjective: Symptoms/Limitations Symptoms: right today pain today 6/10;neck 3/10 pain no back pain. Pt also states that home exercises given last week increase back and hip pain during and after.      Exercise/Treatments       Stretches Other Neck Stretches: 3D neck excursion 10x   Seated Exercises Neck Retraction: 10 reps;3 secs Other Seated Exercise: cerivical spine matrix with 2lb dowel 10x     Stretches ITB Stretch: 3 reps;30 seconds;Limitations (foot on 4"box) Piriformis Stretch: 3 reps;20 seconds;Limitations Piriformis Stretch Limitations: Seated    Manual Therapy Joint Mobilization: C1,2,5,6 up down glides  (performed by [employer], DPT) Massage: STM to cervical trap area and supoccipital release as well strain counter strain to B TFL/ITB  Physical Therapy Assessment and Plan PT Assessment and Plan PT Plan: Next session perform exact same treatment/exercises to continue improvements made in cervical spine and progress hip mobility.;continue with PT POC, check with pt to see how doing cervical spine excursion seated help with pain (standing was increasing pain)    Goals    Problem List Patient Active Problem List   Diagnosis Date Noted  . Stiffness of joint, not elsewhere classified, pelvic region and thigh 05/27/2014  . Stiffness of joints, not elsewhere classified, multiple sites 05/27/2014  . Lumbago 05/27/2014  . Cervicalgia 05/27/2014  . Pain in thoracic spine 05/27/2014  . Muscle weakness (generalized) 05/27/2014  . Raynaud's syndrome 02/12/2014  . Rheumatoid arthritis 02/12/2014  . Screening 10/08/2012  . Fatty liver  10/08/2012    PT - End of Session Activity Tolerance: Patient tolerated treatment well General Behavior During Therapy: [facility] for tasks assessed/performed  GP    Loreen Bankson ATKINSO 06/09/2014, 4:00 PM

## 2014-06-11 ENCOUNTER — Ambulatory Visit (HOSPITAL_COMMUNITY): Payer: PRIVATE HEALTH INSURANCE

## 2014-06-21 ENCOUNTER — Telehealth (HOSPITAL_COMMUNITY): Payer: Self-pay

## 2014-06-21 ENCOUNTER — Ambulatory Visit (HOSPITAL_COMMUNITY): Payer: PRIVATE HEALTH INSURANCE | Admitting: Physical Therapy

## 2014-06-21 NOTE — Telephone Encounter (Signed)
cx - left a message to cx all appointments but no reason was given

## 2014-06-23 ENCOUNTER — Ambulatory Visit (HOSPITAL_COMMUNITY): Payer: PRIVATE HEALTH INSURANCE | Admitting: Physical Therapy

## 2014-06-28 ENCOUNTER — Ambulatory Visit (HOSPITAL_COMMUNITY): Payer: PRIVATE HEALTH INSURANCE

## 2014-06-30 ENCOUNTER — Ambulatory Visit (HOSPITAL_COMMUNITY): Payer: PRIVATE HEALTH INSURANCE | Admitting: Physical Therapy

## 2014-07-07 ENCOUNTER — Ambulatory Visit (HOSPITAL_COMMUNITY): Payer: PRIVATE HEALTH INSURANCE | Admitting: Physical Therapy

## 2014-07-09 ENCOUNTER — Ambulatory Visit (HOSPITAL_COMMUNITY): Payer: PRIVATE HEALTH INSURANCE | Admitting: Physical Therapy

## 2014-07-12 ENCOUNTER — Ambulatory Visit (HOSPITAL_COMMUNITY): Payer: PRIVATE HEALTH INSURANCE | Attending: Pain Medicine | Admitting: Physical Therapy

## 2014-08-12 ENCOUNTER — Encounter (HOSPITAL_COMMUNITY): Payer: Self-pay | Admitting: Physical Therapy

## 2014-08-12 NOTE — Therapy (Signed)
[facility] [address] [place], [place], [address] Phone: [phone]   Fax:  [phone]  Patient Details  Name: Steve Mccormick MRN: [id number] Date of Birth: February 19, 1961  Encounter Date: 08/12/2014  PHYSICAL THERAPY DISCHARGE SUMMARY  Visits from Start of Care: 5  Current functional level related to goals / functional outcomes: PT Short Term Goals Time to Complete Short Term Goals: 4 weeks PT Short Term Goal 1: Patient will demosntrate increased ankle dorsiflexion to > 11 degrees bilaterally to decrease early heel off during gait PT Short Term Goal 2: Patient will dmeosntrate increased hip internal rotation bilaterally to >15 degrees to improve shock absorbtion mechanics during gait PT Short Term Goal 3: Patient demsontrate increased Lumbar extension to >30 degrees PT Short Term Goal 4: Patient demsontrate increased Thoracic extension to WNL PT Short Term Goal 5: Patient will be able to rotate head Lt and Rt >70 degrees PT Long Term Goals Time to Complete Long Term Goals: 8 weeks PT Long Term Goal 1: Patient will dmeosntrate increased hip internal rotation bilaterally to >30 degrees to improve shock absorbtion mechanics during gait PT Long Term Goal 2: Patient will demosntrate increased glut max/med strength to > 4/5 MMT    Plan: Patient agrees to discharge.  Patient goals were not met. Patient is being discharged due to not returning since the last visit.  ?????        Devona Konig PT DPT [phone]

## 2014-10-11 ENCOUNTER — Other Ambulatory Visit: Payer: Self-pay | Admitting: Rheumatology

## 2014-10-13 ENCOUNTER — Other Ambulatory Visit: Payer: Self-pay | Admitting: Rheumatology

## 2014-10-13 DIAGNOSIS — M25552 Pain in left hip: Secondary | ICD-10-CM

## 2014-10-27 ENCOUNTER — Ambulatory Visit
Admission: RE | Admit: 2014-10-27 | Discharge: 2014-10-27 | Disposition: A | Discharge status: Home / Self Care | Payer: PRIVATE HEALTH INSURANCE | Source: Ambulatory Visit | Attending: Rheumatology | Admitting: Rheumatology

## 2014-10-27 DIAGNOSIS — M25552 Pain in left hip: Secondary | ICD-10-CM

## 2014-10-27 MED ORDER — GADOBENATE DIMEGLUMINE 529 MG/ML IV SOLN
20.0000 mL | Freq: Once | INTRAVENOUS | Status: AC | PRN
  Administered 2014-10-27: 20 mL via INTRAVENOUS

## 2015-05-22 ENCOUNTER — Encounter (HOSPITAL_COMMUNITY): Payer: Self-pay | Admitting: Emergency Medicine

## 2015-05-22 ENCOUNTER — Emergency Department (HOSPITAL_COMMUNITY)
Admission: EM | Admit: 2015-05-22 | Discharge: 2015-05-22 | Disposition: A | Discharge status: Home / Self Care | Payer: PRIVATE HEALTH INSURANCE | Attending: Emergency Medicine | Admitting: Emergency Medicine

## 2015-05-22 DIAGNOSIS — S0101XA Laceration without foreign body of scalp, initial encounter: Secondary | ICD-10-CM | POA: Diagnosis not present

## 2015-05-22 DIAGNOSIS — W2209XA Striking against other stationary object, initial encounter: Secondary | ICD-10-CM | POA: Diagnosis not present

## 2015-05-22 DIAGNOSIS — Y9389 Activity, other specified: Secondary | ICD-10-CM | POA: Diagnosis not present

## 2015-05-22 DIAGNOSIS — I10 Essential (primary) hypertension: Secondary | ICD-10-CM | POA: Diagnosis not present

## 2015-05-22 DIAGNOSIS — Y998 Other external cause status: Secondary | ICD-10-CM | POA: Diagnosis not present

## 2015-05-22 DIAGNOSIS — S0990XA Unspecified injury of head, initial encounter: Secondary | ICD-10-CM | POA: Diagnosis present

## 2015-05-22 DIAGNOSIS — Y9289 Other specified places as the place of occurrence of the external cause: Secondary | ICD-10-CM | POA: Insufficient documentation

## 2015-05-22 DIAGNOSIS — K219 Gastro-esophageal reflux disease without esophagitis: Secondary | ICD-10-CM | POA: Diagnosis not present

## 2015-05-22 DIAGNOSIS — E119 Type 2 diabetes mellitus without complications: Secondary | ICD-10-CM | POA: Diagnosis not present

## 2015-05-22 DIAGNOSIS — Z79899 Other long term (current) drug therapy: Secondary | ICD-10-CM | POA: Insufficient documentation

## 2015-05-22 DIAGNOSIS — Z791 Long term (current) use of non-steroidal anti-inflammatories (NSAID): Secondary | ICD-10-CM | POA: Insufficient documentation

## 2015-05-22 MED ORDER — BUPIVACAINE-EPINEPHRINE (PF) 0.5% -1:200000 IJ SOLN
INTRAMUSCULAR | Status: DC
Start: 2015-05-22 — End: 2015-05-22
  Filled 2015-05-22: qty 30

## 2015-05-22 MED ORDER — LIDOCAINE-EPINEPHRINE (PF) 1 %-1:200000 IJ SOLN
INTRAMUSCULAR | Status: AC
  Filled 2015-05-22: qty 10

## 2015-05-22 MED ORDER — BUPIVACAINE-EPINEPHRINE 0.25% -1:200000 IJ SOLN
10.0000 mL | Freq: Once | INTRAMUSCULAR | Status: DC
  Filled 2015-05-22: qty 10
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
## 2015-05-22 NOTE — Discharge Instructions (Signed)
Please keep wound clean and DRY. Have staples removed in 7 to 10 days. Sutured Wound Care Sutures are stitches that can be used to close wounds. Caring for your wound can help stop infection and lessen pain. HOME CARE   Rest and raise (elevate) the injured area until the pain and puffiness (swelling) go away.  Only take medicines as told by your doctor.  Clean the wound gently with mild soap and water once a day after the first 2 days. Rinse off the soap. Pat the area dry with a clean towel. Do not rub the wound.  Change the bandage (dressing) as told by your doctor. If the bandage sticks, soak it off with soapy water. Stop using a bandage after 2 days or after the wound stops leaking fluid.  Put cream on the wound as told by your doctor.  Do not stretch the wound.  Drink enough fluids to keep your pee (urine) clear or pale yellow.  See your doctor to have the sutures removed.  Use sunscreen or sunblock on the wound after it heals. GET HELP RIGHT AWAY IF:   Your wound gets red, puffy, hot, or tender.  You have more pain in the wound.  You have a red streak that goes away from the wound.  You see yellowish-white fluid (pus) coming out of the wound.  You have a fever.  You have chills and start to shake.  You notice a bad smell coming from the wound.  Your wound will not stop bleeding. MAKE SURE YOU:   Understand these instructions.  Will watch your condition.  Will get help right away if you are not doing well or get worse. Document Released: 02/13/2008 Document Revised: 11/19/2011 Document Reviewed: 12/31/2010 [facility] Patient Information 2015 [place], [place]. This information is not intended to replace advice given to you by your health care provider. Make sure you discuss any questions you have with your health care provider.
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
## 2015-08-30 ENCOUNTER — Ambulatory Visit
Admission: RE | Admit: 2015-08-30 | Discharge: 2015-08-30 | Disposition: A | Discharge status: Home / Self Care | Payer: PRIVATE HEALTH INSURANCE | Source: Ambulatory Visit | Attending: Anesthesiology | Admitting: Anesthesiology

## 2015-08-30 ENCOUNTER — Other Ambulatory Visit: Payer: Self-pay | Admitting: Anesthesiology

## 2015-08-30 DIAGNOSIS — M25562 Pain in left knee: Secondary | ICD-10-CM

## 2018-01-16 ENCOUNTER — Ambulatory Visit
Admission: RE | Admit: 2018-01-16 | Discharge: 2018-01-16 | Disposition: A | Discharge status: Home / Self Care | Payer: PRIVATE HEALTH INSURANCE | Source: Ambulatory Visit | Attending: Anesthesiology | Admitting: Anesthesiology

## 2018-01-16 ENCOUNTER — Other Ambulatory Visit: Payer: Self-pay | Admitting: Anesthesiology

## 2018-01-16 DIAGNOSIS — M25552 Pain in left hip: Secondary | ICD-10-CM

## 2018-01-23 ENCOUNTER — Other Ambulatory Visit: Payer: Self-pay | Admitting: Anesthesiology

## 2018-01-23 DIAGNOSIS — M25512 Pain in left shoulder: Secondary | ICD-10-CM

## 2018-01-23 DIAGNOSIS — M6740 Ganglion, unspecified site: Secondary | ICD-10-CM

## 2018-01-31 ENCOUNTER — Ambulatory Visit
Admission: RE | Admit: 2018-01-31 | Discharge: 2018-01-31 | Disposition: A | Discharge status: Home / Self Care | Payer: PRIVATE HEALTH INSURANCE | Source: Ambulatory Visit | Attending: Anesthesiology | Admitting: Anesthesiology

## 2018-01-31 DIAGNOSIS — M6740 Ganglion, unspecified site: Secondary | ICD-10-CM

## 2018-01-31 DIAGNOSIS — M25512 Pain in left shoulder: Secondary | ICD-10-CM

## 2018-05-15 DIAGNOSIS — Z6836 Body mass index (BMI) 36.0-36.9, adult: Secondary | ICD-10-CM | POA: Diagnosis not present

## 2018-05-15 DIAGNOSIS — M150 Primary generalized (osteo)arthritis: Secondary | ICD-10-CM | POA: Diagnosis not present

## 2018-05-15 DIAGNOSIS — E669 Obesity, unspecified: Secondary | ICD-10-CM | POA: Diagnosis not present

## 2018-05-15 DIAGNOSIS — M2550 Pain in unspecified joint: Secondary | ICD-10-CM | POA: Diagnosis not present

## 2018-05-15 DIAGNOSIS — L4050 Arthropathic psoriasis, unspecified: Secondary | ICD-10-CM | POA: Diagnosis not present

## 2018-05-15 DIAGNOSIS — Z79899 Other long term (current) drug therapy: Secondary | ICD-10-CM | POA: Diagnosis not present

## 2018-05-15 DIAGNOSIS — L409 Psoriasis, unspecified: Secondary | ICD-10-CM | POA: Diagnosis not present

## 2018-05-15 DIAGNOSIS — Z1589 Genetic susceptibility to other disease: Secondary | ICD-10-CM | POA: Diagnosis not present

## 2018-07-02 DIAGNOSIS — L4050 Arthropathic psoriasis, unspecified: Secondary | ICD-10-CM | POA: Diagnosis not present

## 2018-07-14 DIAGNOSIS — L4050 Arthropathic psoriasis, unspecified: Secondary | ICD-10-CM | POA: Diagnosis not present

## 2018-07-14 DIAGNOSIS — I10 Essential (primary) hypertension: Secondary | ICD-10-CM | POA: Diagnosis not present

## 2018-07-14 DIAGNOSIS — E1129 Type 2 diabetes mellitus with other diabetic kidney complication: Secondary | ICD-10-CM | POA: Diagnosis not present

## 2018-07-16 DIAGNOSIS — L4050 Arthropathic psoriasis, unspecified: Secondary | ICD-10-CM | POA: Diagnosis not present

## 2018-08-12 DIAGNOSIS — M47812 Spondylosis without myelopathy or radiculopathy, cervical region: Secondary | ICD-10-CM | POA: Diagnosis not present

## 2018-08-12 DIAGNOSIS — G894 Chronic pain syndrome: Secondary | ICD-10-CM | POA: Diagnosis not present

## 2018-08-12 DIAGNOSIS — M47816 Spondylosis without myelopathy or radiculopathy, lumbar region: Secondary | ICD-10-CM | POA: Diagnosis not present

## 2018-08-12 DIAGNOSIS — L4050 Arthropathic psoriasis, unspecified: Secondary | ICD-10-CM | POA: Diagnosis not present

## 2018-08-21 DIAGNOSIS — L4050 Arthropathic psoriasis, unspecified: Secondary | ICD-10-CM | POA: Diagnosis not present

## 2018-09-15 DIAGNOSIS — L4050 Arthropathic psoriasis, unspecified: Secondary | ICD-10-CM | POA: Diagnosis not present

## 2018-09-15 DIAGNOSIS — M47816 Spondylosis without myelopathy or radiculopathy, lumbar region: Secondary | ICD-10-CM | POA: Diagnosis not present

## 2018-09-15 DIAGNOSIS — G894 Chronic pain syndrome: Secondary | ICD-10-CM | POA: Diagnosis not present

## 2018-09-15 DIAGNOSIS — M47812 Spondylosis without myelopathy or radiculopathy, cervical region: Secondary | ICD-10-CM | POA: Diagnosis not present

## 2018-09-26 DIAGNOSIS — M2550 Pain in unspecified joint: Secondary | ICD-10-CM | POA: Diagnosis not present

## 2018-09-26 DIAGNOSIS — L409 Psoriasis, unspecified: Secondary | ICD-10-CM | POA: Diagnosis not present

## 2018-09-26 DIAGNOSIS — L4050 Arthropathic psoriasis, unspecified: Secondary | ICD-10-CM | POA: Diagnosis not present

## 2018-10-08 DIAGNOSIS — E1129 Type 2 diabetes mellitus with other diabetic kidney complication: Secondary | ICD-10-CM | POA: Diagnosis not present

## 2018-10-13 DIAGNOSIS — L4050 Arthropathic psoriasis, unspecified: Secondary | ICD-10-CM | POA: Diagnosis not present

## 2018-10-13 DIAGNOSIS — M47816 Spondylosis without myelopathy or radiculopathy, lumbar region: Secondary | ICD-10-CM | POA: Diagnosis not present

## 2018-10-13 DIAGNOSIS — G894 Chronic pain syndrome: Secondary | ICD-10-CM | POA: Diagnosis not present

## 2018-10-13 DIAGNOSIS — M47812 Spondylosis without myelopathy or radiculopathy, cervical region: Secondary | ICD-10-CM | POA: Diagnosis not present

## 2018-10-15 DIAGNOSIS — I10 Essential (primary) hypertension: Secondary | ICD-10-CM | POA: Diagnosis not present

## 2018-10-15 DIAGNOSIS — E1129 Type 2 diabetes mellitus with other diabetic kidney complication: Secondary | ICD-10-CM | POA: Diagnosis not present

## 2018-10-16 DIAGNOSIS — L4050 Arthropathic psoriasis, unspecified: Secondary | ICD-10-CM | POA: Diagnosis not present

## 2018-11-10 DIAGNOSIS — L4050 Arthropathic psoriasis, unspecified: Secondary | ICD-10-CM | POA: Diagnosis not present

## 2018-11-10 DIAGNOSIS — M47816 Spondylosis without myelopathy or radiculopathy, lumbar region: Secondary | ICD-10-CM | POA: Diagnosis not present

## 2018-11-10 DIAGNOSIS — M47812 Spondylosis without myelopathy or radiculopathy, cervical region: Secondary | ICD-10-CM | POA: Diagnosis not present

## 2018-11-10 DIAGNOSIS — G894 Chronic pain syndrome: Secondary | ICD-10-CM | POA: Diagnosis not present

## 2018-11-24 DIAGNOSIS — M25552 Pain in left hip: Secondary | ICD-10-CM | POA: Diagnosis not present

## 2018-12-11 DIAGNOSIS — L4050 Arthropathic psoriasis, unspecified: Secondary | ICD-10-CM | POA: Diagnosis not present

## 2019-01-05 DIAGNOSIS — L4050 Arthropathic psoriasis, unspecified: Secondary | ICD-10-CM | POA: Diagnosis not present

## 2019-01-05 DIAGNOSIS — M47816 Spondylosis without myelopathy or radiculopathy, lumbar region: Secondary | ICD-10-CM | POA: Diagnosis not present

## 2019-01-05 DIAGNOSIS — M47812 Spondylosis without myelopathy or radiculopathy, cervical region: Secondary | ICD-10-CM | POA: Diagnosis not present

## 2019-01-05 DIAGNOSIS — G894 Chronic pain syndrome: Secondary | ICD-10-CM | POA: Diagnosis not present

## 2019-01-06 DIAGNOSIS — E1129 Type 2 diabetes mellitus with other diabetic kidney complication: Secondary | ICD-10-CM | POA: Diagnosis not present

## 2019-01-13 DIAGNOSIS — K589 Irritable bowel syndrome without diarrhea: Secondary | ICD-10-CM | POA: Diagnosis not present

## 2019-01-13 DIAGNOSIS — E1129 Type 2 diabetes mellitus with other diabetic kidney complication: Secondary | ICD-10-CM | POA: Diagnosis not present

## 2019-01-21 DIAGNOSIS — M2550 Pain in unspecified joint: Secondary | ICD-10-CM | POA: Diagnosis not present

## 2019-01-21 DIAGNOSIS — L409 Psoriasis, unspecified: Secondary | ICD-10-CM | POA: Diagnosis not present

## 2019-01-21 DIAGNOSIS — L4050 Arthropathic psoriasis, unspecified: Secondary | ICD-10-CM | POA: Diagnosis not present

## 2019-01-21 DIAGNOSIS — M150 Primary generalized (osteo)arthritis: Secondary | ICD-10-CM | POA: Diagnosis not present

## 2019-02-05 DIAGNOSIS — Z79899 Other long term (current) drug therapy: Secondary | ICD-10-CM | POA: Diagnosis not present

## 2019-02-05 DIAGNOSIS — L4050 Arthropathic psoriasis, unspecified: Secondary | ICD-10-CM | POA: Diagnosis not present

## 2019-02-09 ENCOUNTER — Other Ambulatory Visit: Payer: Self-pay | Admitting: Anesthesiology

## 2019-02-09 DIAGNOSIS — M25551 Pain in right hip: Secondary | ICD-10-CM

## 2019-02-18 ENCOUNTER — Other Ambulatory Visit: Payer: Self-pay | Admitting: Anesthesiology

## 2019-02-18 DIAGNOSIS — M25552 Pain in left hip: Secondary | ICD-10-CM

## 2019-02-20 ENCOUNTER — Other Ambulatory Visit: Payer: PRIVATE HEALTH INSURANCE

## 2019-03-05 ENCOUNTER — Ambulatory Visit
Admission: RE | Admit: 2019-03-05 | Discharge: 2019-03-05 | Disposition: A | Discharge status: Home / Self Care | Payer: Medicare Other | Source: Ambulatory Visit | Attending: Anesthesiology | Admitting: Anesthesiology

## 2019-03-05 ENCOUNTER — Other Ambulatory Visit: Payer: Self-pay

## 2019-03-05 DIAGNOSIS — M71352 Other bursal cyst, left hip: Secondary | ICD-10-CM | POA: Diagnosis not present

## 2019-03-05 DIAGNOSIS — M25552 Pain in left hip: Secondary | ICD-10-CM

## 2019-03-05 DIAGNOSIS — M160 Bilateral primary osteoarthritis of hip: Secondary | ICD-10-CM | POA: Diagnosis not present

## 2019-03-09 DIAGNOSIS — Z79899 Other long term (current) drug therapy: Secondary | ICD-10-CM | POA: Diagnosis not present

## 2019-03-09 DIAGNOSIS — G894 Chronic pain syndrome: Secondary | ICD-10-CM | POA: Diagnosis not present

## 2019-03-09 DIAGNOSIS — M47816 Spondylosis without myelopathy or radiculopathy, lumbar region: Secondary | ICD-10-CM | POA: Diagnosis not present

## 2019-03-09 DIAGNOSIS — M47812 Spondylosis without myelopathy or radiculopathy, cervical region: Secondary | ICD-10-CM | POA: Diagnosis not present

## 2019-03-09 DIAGNOSIS — Z79891 Long term (current) use of opiate analgesic: Secondary | ICD-10-CM | POA: Diagnosis not present

## 2019-03-09 DIAGNOSIS — L4050 Arthropathic psoriasis, unspecified: Secondary | ICD-10-CM | POA: Diagnosis not present

## 2019-03-20 DIAGNOSIS — M1612 Unilateral primary osteoarthritis, left hip: Secondary | ICD-10-CM | POA: Diagnosis not present

## 2019-04-02 DIAGNOSIS — L4050 Arthropathic psoriasis, unspecified: Secondary | ICD-10-CM | POA: Diagnosis not present

## 2019-04-20 DIAGNOSIS — E1129 Type 2 diabetes mellitus with other diabetic kidney complication: Secondary | ICD-10-CM | POA: Diagnosis not present

## 2019-04-20 DIAGNOSIS — G894 Chronic pain syndrome: Secondary | ICD-10-CM | POA: Diagnosis not present

## 2019-04-20 DIAGNOSIS — M47812 Spondylosis without myelopathy or radiculopathy, cervical region: Secondary | ICD-10-CM | POA: Diagnosis not present

## 2019-04-20 DIAGNOSIS — L4050 Arthropathic psoriasis, unspecified: Secondary | ICD-10-CM | POA: Diagnosis not present

## 2019-04-20 DIAGNOSIS — M47816 Spondylosis without myelopathy or radiculopathy, lumbar region: Secondary | ICD-10-CM | POA: Diagnosis not present

## 2019-04-27 DIAGNOSIS — E1129 Type 2 diabetes mellitus with other diabetic kidney complication: Secondary | ICD-10-CM | POA: Diagnosis not present

## 2019-04-27 DIAGNOSIS — K589 Irritable bowel syndrome without diarrhea: Secondary | ICD-10-CM | POA: Diagnosis not present

## 2019-04-28 ENCOUNTER — Encounter: Payer: Self-pay | Admitting: Internal Medicine

## 2019-05-26 ENCOUNTER — Ambulatory Visit: Payer: Medicare Other | Admitting: Gastroenterology

## 2019-05-26 ENCOUNTER — Other Ambulatory Visit: Payer: Self-pay

## 2019-05-26 ENCOUNTER — Encounter: Payer: Self-pay | Admitting: Gastroenterology

## 2019-05-26 DIAGNOSIS — Z8371 Family history of colonic polyps: Secondary | ICD-10-CM

## 2019-05-26 DIAGNOSIS — R1013 Epigastric pain: Secondary | ICD-10-CM

## 2019-05-26 DIAGNOSIS — Z800 Family history of malignant neoplasm of digestive organs: Secondary | ICD-10-CM | POA: Diagnosis not present

## 2019-05-26 DIAGNOSIS — R198 Other specified symptoms and signs involving the digestive system and abdomen: Secondary | ICD-10-CM

## 2019-05-26 DIAGNOSIS — M150 Primary generalized (osteo)arthritis: Secondary | ICD-10-CM | POA: Diagnosis not present

## 2019-05-26 DIAGNOSIS — K219 Gastro-esophageal reflux disease without esophagitis: Secondary | ICD-10-CM | POA: Diagnosis not present

## 2019-05-26 DIAGNOSIS — M2550 Pain in unspecified joint: Secondary | ICD-10-CM | POA: Diagnosis not present

## 2019-05-26 DIAGNOSIS — L4050 Arthropathic psoriasis, unspecified: Secondary | ICD-10-CM | POA: Diagnosis not present

## 2019-05-26 DIAGNOSIS — Z83719 Family history of colon polyps, unspecified: Secondary | ICD-10-CM | POA: Insufficient documentation

## 2019-05-26 DIAGNOSIS — L409 Psoriasis, unspecified: Secondary | ICD-10-CM | POA: Diagnosis not present

## 2019-05-26 MED ORDER — PANTOPRAZOLE SODIUM 40 MG PO TBEC: 40.0000 mg | DELAYED_RELEASE_TABLET | Freq: Every day | ORAL | 3 refills | Status: DC

## 2019-05-26 MED ORDER — DICYCLOMINE HCL 10 MG PO CAPS: ORAL_CAPSULE | ORAL | 3 refills | Status: DC

## 2019-05-26 NOTE — Assessment & Plan Note (Addendum)
Longstanding GERD for greater than 5 years. Having breakthrough heartburn and epigastric pain in setting of Celebrex. Recommend EGD to rule out complicated GERD, Barrett's, gastritis/PUD. Plan for deep sedation given chronic pain medication use.  I have discussed the risks, alternatives, benefits with regards to but not limited to the risk of reaction to medication, bleeding, infection, perforation and the patient is agreeable to proceed. Written consent to be obtained.  Switch Nexium to pantoprazole 40mg  daily before breakfast.

## 2019-05-26 NOTE — Assessment & Plan Note (Addendum)
Change in bowel habits, alternating constipation and diarrhea associated with abdominal cramping which wakes him up at night at times.  Family history of colon polyps and colon cancer as outlined.  Plan for 5-year follow-up colonoscopy.  Given current bowel symptoms, proceed with colonoscopy with deep sedation.  I have discussed the risks, alternatives, benefits with regards to but not limited to the risk of reaction to medication, bleeding, infection, perforation and the patient is agreeable to proceed. Written consent to be obtained.  Trial of Bentyl 10 mg up to 4 times daily as needed for abdominal cramping and diarrhea. Reviewed most recent labs from PCP in [facility] rheumatology.

## 2019-05-26 NOTE — Progress Notes (Signed)
Primary Care Physician: Asencion Noble, MD  Primary Gastroenterologist:  Garfield Cornea, MD   Chief Complaint  Patient presents with  . Irritable Bowel Syndrome    constipation/diarrhea, abd cramps in the night    HPI: Steve Mccormick is a 58 y.o. male here at the request of Dr. Willey Blade for further evaluation of bowel concerns.  We last saw the patient at time of colonoscopy in February 2014.  He had diverticulosis.  He has a family history of colon polyps and second-degree relatives with history of colon cancer, Dr. Gala Romney advised 5-year follow-up colonoscopy (was due in 2019).  Patient also has a history of fatty liver, chronic GERD.  No prior EGD.  For the past several months has had issues with abdominal cramping, alternating constipation and diarrhea all within the same week.  Tendency more towards diarrhea.  He may go a few days without a bowel movement and then have 4-5 bowel movements in a day.  Sometimes uses MiraLAX.  Sometimes uses Pepto-Bismol or Imodium to slow the diarrhea.  In the past has been on Levsin/Levbid but lost efficacy.  Complains of painful gas pains, takes Gas-X with minimal relief.  Occasional bright red blood on the toilet tissue he has bad diarrhea days.  He wakes up from sleep at least 2-3 times per month with abdominal cramping.  Typically will have a BM within 2 hours following onset of cramps.  Appetite generally okay.  No nausea or vomiting.  He is having some breakthrough heartburn symptoms.  Takes Nexium 40 mg every morning.  Will take Tums or Rolaids sometimes without relief.  Denies dysphagia.  Has had reflux for years.  No prior endoscopy.  Complains of postprandial bloating.  Avoid spicy foods.  Typically tolerates dairy.  Takes Celebrex twice daily for hip pain.  He is on Remicade every 8 weeks for psoriatic arthritis.  Current Outpatient Medications  Medication Sig Dispense Refill  . celecoxib (CELEBREX) 200 MG capsule Take 200 mg by mouth 2 (two)  times daily.    . cetirizine (ZYRTEC) 10 MG tablet Take 10 mg by mouth daily. Allergies     . esomeprazole (NEXIUM) 20 MG capsule Take 40 mg by mouth daily before breakfast.    . gabapentin (NEURONTIN) 400 MG capsule Take 400 mg by mouth 4 (four) times daily.     Marland Kitchen glipiZIDE (GLUCOTROL) 10 MG tablet Take 10 mg by mouth daily.    Marland Kitchen HYDROcodone-acetaminophen (NORCO) 7.5-325 MG tablet Take 1 tablet by mouth 4 (four) times daily.     Marland Kitchen inFLIXimab (REMICADE IV) Inject into the vein. Reflexes every 8 weeks    . methocarbamol (ROBAXIN) 750 MG tablet Take 750 mg by mouth 4 (four) times daily.    . Multiple Vitamin (MULTIVITAMIN) tablet Take 1 tablet by mouth daily.    . tamsulosin (FLOMAX) 0.4 MG CAPS capsule Take 0.4 mg by mouth daily after supper.     No current facility-administered medications for this visit.     Allergies as of 05/26/2019 - Review Complete 05/26/2019  Allergen Reaction Noted  . Fish allergy Anaphylaxis, Swelling, and Other (See Comments) 10/08/2012  . Percocet [oxycodone-acetaminophen] Itching 02/12/2014   Past Medical History:  Diagnosis Date  . Diabetes mellitus   . Fatty liver   . GERD (gastroesophageal reflux disease)   . Hypertension   . Psoriatic arthritis [facility])    Past Surgical History:  Procedure Laterality Date  . ANKLE SURGERY     bilateral  .  COLONOSCOPY N/A 10/22/2012   Dr. Jena Gauss: colonic diverticulosis. next TCS in 5 years due to [facility] CRC, colon polyps  . Ileocolonoscopy  10/02/2007   RMR: Friable anal canal, otherwise normal rectum.  Left-sided diverticula.  The colonic mucosa and  terminal ileum mucosa appeared normal rectum, left-sided diverticula  . KNEE SURGERY     bilateral  . plantar fasciitis    . SHOULDER SURGERY     bilateral   Family History  Problem Relation Age of Onset  . Colon cancer Maternal Grandmother   . Colon polyps Mother   . Diabetes Mother   . Hypertension Mother   . Hyperlipidemia Mother   . Diabetes Father    Social  History   Socioeconomic History  . Marital status: Married    Spouse name: Not on file  . Number of children: Not on file  . Years of education: Not on file  . Highest education level: Not on file  Occupational History  . Occupation: Training and development officer: [employer]  Social Needs  . Financial resource strain: Not on file  . Food insecurity    Worry: Not on file    Inability: Not on file  . Transportation needs    Medical: Not on file    Non-medical: Not on file  Tobacco Use  . Smoking status: Never Smoker  . Smokeless tobacco: Never Used  Substance and Sexual Activity  . Alcohol use: No  . Drug use: No  . Sexual activity: Not on file  Lifestyle  . Physical activity    Days per week: Not on file    Minutes per session: Not on file  . Stress: Not on file  Relationships  . Social Musician on phone: Not on file    Gets together: Not on file    Attends religious service: Not on file    Active member of club or organization: Not on file    Attends meetings of clubs or organizations: Not on file    Relationship status: Not on file  . Intimate partner violence    Fear of current or ex partner: Not on file    Emotionally abused: Not on file    Physically abused: Not on file    Forced sexual activity: Not on file  Other Topics Concern  . Not on file  Social History Narrative  . Not on file    ROS:  General: Negative for anorexia, weight loss, fever, chills, fatigue, weakness. ENT: Negative for hoarseness, difficulty swallowing , nasal congestion. CV: Negative for chest pain, angina, palpitations, dyspnea on exertion, peripheral edema.  Respiratory: Negative for dyspnea at rest, dyspnea on exertion, cough, sputum, wheezing.  GI: See history of present illness. GU:  Negative for dysuria, hematuria, urinary incontinence, urinary frequency, nocturnal urination.  Endo: Negative for unusual weight change.  Chronic back pain, hip pain   Physical  Examination:   BP 135/71   Pulse 72   Temp (!) 97.2 F (36.2 C) (Oral)   Ht 5\' 11"  (1.803 m)   Wt 252 lb 12.8 oz (114.7 kg)   BMI 35.26 kg/m   General: Well-nourished, well-developed in no acute distress.  Eyes: No icterus. Mouth: Oropharyngeal mucosa moist and pink , no lesions erythema or exudate. Lungs: Clear to auscultation bilaterally.  Heart: Regular rate and rhythm, no murmurs rubs or gallops.  Abdomen: Bowel sounds are normal, nontender, no hepatosplenomegaly or masses, no abdominal bruits or hernia , no rebound  or guarding.  Mild epig pain. Extremities: No lower extremity edema. No clubbing or deformities. Neuro: Alert and oriented x 4   Skin: Warm and dry, no jaundice.   Psych: Alert and cooperative, normal mood and affect.

## 2019-05-26 NOTE — Patient Instructions (Signed)
1. Colonoscopy and upper endoscopy as scheduled. See separate instructions.  2. Stop Nexium. Start pantoprazole 40mg  daily before breakfast for reflux. 3. Try bentyl, one capsule 30 minutes before meals and at bedtime up to four times daily for cramps/diarrhea. Hold for constipation.  4. We will review labs from PCP and [facility] Rheumatology. 5. Call if you have any questions or concerns.

## 2019-05-28 ENCOUNTER — Other Ambulatory Visit: Payer: Self-pay

## 2019-05-28 ENCOUNTER — Telehealth: Payer: Self-pay

## 2019-05-28 DIAGNOSIS — R198 Other specified symptoms and signs involving the digestive system and abdomen: Secondary | ICD-10-CM

## 2019-05-28 DIAGNOSIS — Z8371 Family history of colonic polyps: Secondary | ICD-10-CM

## 2019-05-28 DIAGNOSIS — L4050 Arthropathic psoriasis, unspecified: Secondary | ICD-10-CM | POA: Diagnosis not present

## 2019-05-28 DIAGNOSIS — R1013 Epigastric pain: Secondary | ICD-10-CM

## 2019-05-28 DIAGNOSIS — Z79899 Other long term (current) drug therapy: Secondary | ICD-10-CM | POA: Diagnosis not present

## 2019-05-28 DIAGNOSIS — K219 Gastro-esophageal reflux disease without esophagitis: Secondary | ICD-10-CM

## 2019-05-28 DIAGNOSIS — Z800 Family history of malignant neoplasm of digestive organs: Secondary | ICD-10-CM

## 2019-05-28 MED ORDER — CLENPIQ 10-3.5-12 MG-GM -GM/160ML PO SOLN: 1.0000 | Freq: Once | ORAL | 0 refills | Status: AC

## 2019-05-28 NOTE — Telephone Encounter (Signed)
Called pt, TCS/EGD w/Propofol w/RMR scheduled for 08/14/19 at 7:30am. Rx for prep sent to pharmacy. Orders entered.

## 2019-05-31 NOTE — Progress Notes (Signed)
CC'ED TO PCP 

## 2019-06-01 NOTE — Telephone Encounter (Signed)
Pre-op appt 08/11/19 at 10:00am, COVID test at 11:00am. Appt letter mailed with procedure instructions.

## 2019-06-08 DIAGNOSIS — M47812 Spondylosis without myelopathy or radiculopathy, cervical region: Secondary | ICD-10-CM | POA: Diagnosis not present

## 2019-06-08 DIAGNOSIS — L4050 Arthropathic psoriasis, unspecified: Secondary | ICD-10-CM | POA: Diagnosis not present

## 2019-06-08 DIAGNOSIS — M47816 Spondylosis without myelopathy or radiculopathy, lumbar region: Secondary | ICD-10-CM | POA: Diagnosis not present

## 2019-06-08 DIAGNOSIS — G894 Chronic pain syndrome: Secondary | ICD-10-CM | POA: Diagnosis not present

## 2019-06-12 DIAGNOSIS — M1612 Unilateral primary osteoarthritis, left hip: Secondary | ICD-10-CM | POA: Diagnosis not present

## 2019-07-13 ENCOUNTER — Telehealth: Payer: Self-pay | Admitting: Gastroenterology

## 2019-07-13 DIAGNOSIS — R945 Abnormal results of liver function studies: Secondary | ICD-10-CM

## 2019-07-13 DIAGNOSIS — R7989 Other specified abnormal findings of blood chemistry: Secondary | ICD-10-CM

## 2019-07-13 NOTE — Telephone Encounter (Signed)
Reviewed labs from rheumatology dated 02/28/2019: AST 43, ALT 57, alk phos 66  Labs from May 2020: white blood cell count 7900, hemoglobin 14.1, platelets 229,000 AST 56, ALT 62, alk phos 69, total bilirubin 0.5, glucose 209   Please let pt know I reviewed his labs from rheumatology. His liver numbers have been mildly elevated. Would consider limited work up with labs and update u/s liver. He has h/o fatty liver seen on very old u/s. Let me know if he agrees and I will give specific orders.

## 2019-07-14 NOTE — Telephone Encounter (Signed)
Hep B surface antigen Hep B surface antibody Hep b total core antibody Hep C antibody Hep A total antibody Iron/tibc/ferritin  ruq u/s  Dx: abnormal LFTs

## 2019-07-14 NOTE — Telephone Encounter (Signed)
Spoke with pt. Pt is aware that his records were reviewed from rheumatology. Pt is in agreement with updating u/s and labs. Please advise.

## 2019-07-15 ENCOUNTER — Other Ambulatory Visit: Payer: Self-pay

## 2019-07-15 DIAGNOSIS — R748 Abnormal levels of other serum enzymes: Secondary | ICD-10-CM

## 2019-07-15 NOTE — Addendum Note (Signed)
Addended by: Cheron Every on: 07/15/2019 08:48 AM   Modules accepted: Orders

## 2019-07-15 NOTE — Telephone Encounter (Signed)
Noted. Lab orders placed for [facility] per pts request. Please arrange u/s per LSL.

## 2019-07-15 NOTE — Telephone Encounter (Signed)
RU[facility] [place] scheduled for 11/9 at 8:30am, arrival 8:15am, npo midnight.  Called patient patient aware of appt details.

## 2019-07-20 ENCOUNTER — Ambulatory Visit (HOSPITAL_COMMUNITY): Payer: Medicare Other

## 2019-07-20 DIAGNOSIS — J019 Acute sinusitis, unspecified: Secondary | ICD-10-CM | POA: Diagnosis not present

## 2019-07-23 DIAGNOSIS — I10 Essential (primary) hypertension: Secondary | ICD-10-CM | POA: Diagnosis not present

## 2019-07-23 DIAGNOSIS — Z79899 Other long term (current) drug therapy: Secondary | ICD-10-CM | POA: Diagnosis not present

## 2019-07-23 DIAGNOSIS — R748 Abnormal levels of other serum enzymes: Secondary | ICD-10-CM | POA: Diagnosis not present

## 2019-07-23 DIAGNOSIS — E1129 Type 2 diabetes mellitus with other diabetic kidney complication: Secondary | ICD-10-CM | POA: Diagnosis not present

## 2019-07-24 LAB — IRON,TIBC AND FERRITIN PANEL
Ferritin: 39 ng/mL (ref 30–400)
Iron Saturation: 17 % (ref 15–55)
Iron: 57 ug/dL (ref 38–169)
Total Iron Binding Capacity: 338 ug/dL (ref 250–450)
UIBC: 281 ug/dL (ref 111–343)

## 2019-07-24 LAB — HEPATITIS B SURFACE ANTIGEN: Hepatitis B Surface Ag: NEGATIVE

## 2019-07-24 LAB — HEPATITIS C ANTIBODY: Hep C Virus Ab: <0.1 s/co ratio (ref 0.0–0.9)

## 2019-07-24 LAB — HEPATITIS B SURFACE ANTIBODY,QUALITATIVE: Hep B Surface Ab, Qual: NONREACTIVE

## 2019-07-24 LAB — HEPATITIS A ANTIBODY, TOTAL: hep A Total Ab: NEGATIVE

## 2019-07-24 LAB — HEPATITIS B CORE ANTIBODY, TOTAL: Hep B Core Total Ab: NEGATIVE

## 2019-07-27 DIAGNOSIS — M791 Myalgia, unspecified site: Secondary | ICD-10-CM | POA: Diagnosis not present

## 2019-07-27 DIAGNOSIS — G894 Chronic pain syndrome: Secondary | ICD-10-CM | POA: Diagnosis not present

## 2019-07-27 DIAGNOSIS — E1129 Type 2 diabetes mellitus with other diabetic kidney complication: Secondary | ICD-10-CM | POA: Diagnosis not present

## 2019-07-27 DIAGNOSIS — M47816 Spondylosis without myelopathy or radiculopathy, lumbar region: Secondary | ICD-10-CM | POA: Diagnosis not present

## 2019-07-27 DIAGNOSIS — M1612 Unilateral primary osteoarthritis, left hip: Secondary | ICD-10-CM | POA: Diagnosis not present

## 2019-07-27 DIAGNOSIS — K760 Fatty (change of) liver, not elsewhere classified: Secondary | ICD-10-CM | POA: Diagnosis not present

## 2019-07-27 DIAGNOSIS — M47812 Spondylosis without myelopathy or radiculopathy, cervical region: Secondary | ICD-10-CM | POA: Diagnosis not present

## 2019-07-27 DIAGNOSIS — L4050 Arthropathic psoriasis, unspecified: Secondary | ICD-10-CM | POA: Diagnosis not present

## 2019-07-28 ENCOUNTER — Other Ambulatory Visit: Payer: Self-pay

## 2019-07-28 ENCOUNTER — Ambulatory Visit (HOSPITAL_COMMUNITY)
Admission: RE | Admit: 2019-07-28 | Discharge: 2019-07-28 | Disposition: A | Discharge status: Home / Self Care | Payer: Medicare Other | Source: Ambulatory Visit | Attending: Gastroenterology | Admitting: Gastroenterology

## 2019-07-28 DIAGNOSIS — Z23 Encounter for immunization: Secondary | ICD-10-CM | POA: Diagnosis not present

## 2019-07-28 DIAGNOSIS — R7989 Other specified abnormal findings of blood chemistry: Secondary | ICD-10-CM

## 2019-07-28 DIAGNOSIS — K760 Fatty (change of) liver, not elsewhere classified: Secondary | ICD-10-CM | POA: Diagnosis not present

## 2019-07-28 DIAGNOSIS — R945 Abnormal results of liver function studies: Secondary | ICD-10-CM | POA: Diagnosis not present

## 2019-08-03 DIAGNOSIS — L4050 Arthropathic psoriasis, unspecified: Secondary | ICD-10-CM | POA: Diagnosis not present

## 2019-08-04 ENCOUNTER — Telehealth: Payer: Self-pay | Admitting: Internal Medicine

## 2019-08-04 ENCOUNTER — Other Ambulatory Visit: Payer: Self-pay

## 2019-08-04 DIAGNOSIS — K760 Fatty (change of) liver, not elsewhere classified: Secondary | ICD-10-CM

## 2019-08-04 NOTE — Telephone Encounter (Signed)
Pt has [employer] Medicare, no PA is needed for procedure.  Tried to call Kizzy, no answer, LMOVM to inform her no PA is needed for [facility] Medicare.

## 2019-08-04 NOTE — Patient Instructions (Signed)
Steve Mccormick  08/04/2019     @PREFPERIOPPHARMACY @   Your procedure is scheduled on  08/13/2019 .  Report to 14/11/2018 at  [phone]  A.M.  Call this number if you have problems the morning of surgery:  [phone]   Remember:  Follow the diet and prep instructions given to you by Dr [phone] office.                    Take these medicines the morning of surgery with A SIP OF WATER  Celebrex(if needed), gabapentin, hydrocodone(if needed), robaxin(if needed), protonix.    Do not wear jewelry, make-up or nail polish.  Do not wear lotions, powders, or perfumes. Please wear deodorant and brush your teeth.  Do not shave 48 hours prior to surgery.  Men may shave face and neck.  Do not bring valuables to the hospital.  [facility] is not responsible for any belongings or valuables.  Contacts, dentures or bridgework may not be worn into surgery.  Leave your suitcase in the car.  After surgery it may be brought to your room.  For patients admitted to the hospital, discharge time will be determined by your treatment team.  Patients discharged the day of surgery will not be allowed to drive home.   Name and phone number of your driver:   family Special instructions:  None  Please read over the following fact sheets that you were given. Anesthesia Post-op Instructions and Care and Recovery After Surgery       Upper Endoscopy, Adult, Care After This sheet gives you information about how to care for yourself after your procedure. Your health care provider may also give you more specific instructions. If you have problems or questions, contact your health care provider. What can I expect after the procedure? After the procedure, it is common to have:  A sore throat.  Mild stomach pain or discomfort.  Bloating.  Nausea. Follow these instructions at home:   Follow instructions from your health care provider about what to eat or drink after your procedure.  Return to  your normal activities as told by your health care provider. Ask your health care provider what activities are safe for you.  Take over-the-counter and prescription medicines only as told by your health care provider.  Do not drive for 24 hours if you were given a sedative during your procedure.  Keep all follow-up visits as told by your health care provider. This is important. Contact a health care provider if you have:  A sore throat that lasts longer than one day.  Trouble swallowing. Get help right away if:  You vomit blood or your vomit looks like coffee grounds.  You have: ? A fever. ? Bloody, black, or tarry stools. ? A severe sore throat or you cannot swallow. ? Difficulty breathing. ? Severe pain in your chest or abdomen. Summary  After the procedure, it is common to have a sore throat, mild stomach discomfort, bloating, and nausea.  Do not drive for 24 hours if you were given a sedative during the procedure.  Follow instructions from your health care provider about what to eat or drink after your procedure.  Return to your normal activities as told by your health care provider. This information is not intended to replace advice given to you by your health care provider. Make sure you discuss any questions you have with your health care provider. Document Released: 02/26/2012 Document Revised:  02/18/2018 Document Reviewed: 01/27/2018 Elsevier Patient Education  2020 [employer]  Colonoscopy, Adult, Care After This sheet gives you information about how to care for yourself after your procedure. Your health care provider may also give you more specific instructions. If you have problems or questions, contact your health care provider. What can I expect after the procedure? After the procedure, it is common to have:  A small amount of blood in your stool for 24 hours after the procedure.  Some gas.  Mild abdominal cramping or bloating. Follow these instructions  at home: General instructions  For the first 24 hours after the procedure: ? Do not drive or use machinery. ? Do not sign important documents. ? Do not drink alcohol. ? Do your regular daily activities at a slower pace than normal. ? Eat soft, easy-to-digest foods.  Take over-the-counter or prescription medicines only as told by your health care provider. Relieving cramping and bloating   Try walking around when you have cramps or feel bloated.  Apply heat to your abdomen as told by your health care provider. Use a heat source that your health care provider recommends, such as a moist heat pack or a heating pad. ? Place a towel between your skin and the heat source. ? Leave the heat on for 20-30 minutes. ? Remove the heat if your skin turns bright red. This is especially important if you are unable to feel pain, heat, or cold. You may have a greater risk of getting burned. Eating and drinking   Drink enough fluid to keep your urine pale yellow.  Resume your normal diet as instructed by your health care provider. Avoid heavy or fried foods that are hard to digest.  Avoid drinking alcohol for as long as instructed by your health care provider. Contact a health care provider if:  You have blood in your stool 2-3 days after the procedure. Get help right away if:  You have more than a small spotting of blood in your stool.  You pass large blood clots in your stool.  Your abdomen is swollen.  You have nausea or vomiting.  You have a fever.  You have increasing abdominal pain that is not relieved with medicine. Summary  After the procedure, it is common to have a small amount of blood in your stool. You may also have mild abdominal cramping and bloating.  For the first 24 hours after the procedure, do not drive or use machinery, sign important documents, or drink alcohol.  Contact your health care provider if you have a lot of blood in your stool, nausea or vomiting, a  fever, or increased abdominal pain. This information is not intended to replace advice given to you by your health care provider. Make sure you discuss any questions you have with your health care provider. Document Released: 04/10/2004 Document Revised: 06/19/2017 Document Reviewed: 11/08/2015 Elsevier Patient Education  2020 [employer] Monitored Anesthesia Care, Care After These instructions provide you with information about caring for yourself after your procedure. Your health care provider may also give you more specific instructions. Your treatment has been planned according to current medical practices, but problems sometimes occur. Call your health care provider if you have any problems or questions after your procedure. What can I expect after the procedure? After your procedure, you may:  Feel sleepy for several hours.  Feel clumsy and have poor balance for several hours.  Feel forgetful about what happened after the procedure.  Have poor judgment for  several hours.  Feel nauseous or vomit.  Have a sore throat if you had a breathing tube during the procedure. Follow these instructions at home: For at least 24 hours after the procedure:      Have a responsible adult stay with you. It is important to have someone help care for you until you are awake and alert.  Rest as needed.  Do not: ? Participate in activities in which you could fall or become injured. ? Drive. ? Use heavy machinery. ? Drink alcohol. ? Take sleeping pills or medicines that cause drowsiness. ? Make important decisions or sign legal documents. ? Take care of children on your own. Eating and drinking  Follow the diet that is recommended by your health care provider.  If you vomit, drink water, juice, or soup when you can drink without vomiting.  Make sure you have little or no nausea before eating solid foods. General instructions  Take over-the-counter and prescription medicines only as  told by your health care provider.  If you have sleep apnea, surgery and certain medicines can increase your risk for breathing problems. Follow instructions from your health care provider about wearing your sleep device: ? Anytime you are sleeping, including during daytime naps. ? While taking prescription pain medicines, sleeping medicines, or medicines that make you drowsy.  If you smoke, do not smoke without supervision.  Keep all follow-up visits as told by your health care provider. This is important. Contact a health care provider if:  You keep feeling nauseous or you keep vomiting.  You feel light-headed.  You develop a rash.  You have a fever. Get help right away if:  You have trouble breathing. Summary  For several hours after your procedure, you may feel sleepy and have poor judgment.  Have a responsible adult stay with you for at least 24 hours or until you are awake and alert. This information is not intended to replace advice given to you by your health care provider. Make sure you discuss any questions you have with your health care provider. Document Released: 12/18/2015 Document Revised: 11/25/2017 Document Reviewed: 12/18/2015 Elsevier Patient Education  2020 [employer].

## 2019-08-04 NOTE — Telephone Encounter (Signed)
preservice center [facility]) called and said patient needed a PA. His procedure is 08/13/2019 with RMR. [id number] ext [phone]

## 2019-08-10 ENCOUNTER — Other Ambulatory Visit: Payer: Self-pay

## 2019-08-10 DIAGNOSIS — K760 Fatty (change of) liver, not elsewhere classified: Secondary | ICD-10-CM

## 2019-08-11 ENCOUNTER — Encounter (HOSPITAL_COMMUNITY)
Admission: RE | Admit: 2019-08-11 | Discharge: 2019-08-11 | Disposition: A | Discharge status: Home / Self Care | Payer: Medicare Other | Source: Ambulatory Visit | Attending: Internal Medicine | Admitting: Internal Medicine

## 2019-08-11 ENCOUNTER — Encounter (HOSPITAL_COMMUNITY): Payer: Self-pay

## 2019-08-11 ENCOUNTER — Other Ambulatory Visit: Payer: Self-pay

## 2019-08-11 ENCOUNTER — Other Ambulatory Visit (HOSPITAL_COMMUNITY)
Admission: RE | Admit: 2019-08-11 | Discharge: 2019-08-11 | Disposition: A | Discharge status: Home / Self Care | Payer: Medicare Other | Source: Ambulatory Visit | Attending: Internal Medicine | Admitting: Internal Medicine

## 2019-08-11 DIAGNOSIS — I10 Essential (primary) hypertension: Secondary | ICD-10-CM | POA: Diagnosis present

## 2019-08-11 DIAGNOSIS — Z01812 Encounter for preprocedural laboratory examination: Secondary | ICD-10-CM | POA: Insufficient documentation

## 2019-08-11 DIAGNOSIS — M1612 Unilateral primary osteoarthritis, left hip: Secondary | ICD-10-CM | POA: Diagnosis present

## 2019-08-11 LAB — HEPATIC FUNCTION PANEL
ALT: 60 U/L — ABNORMAL HIGH (ref 0–44)
AST: 45 U/L — ABNORMAL HIGH (ref 15–41)
Albumin: 4.5 g/dL (ref 3.5–5.0)
Alkaline Phosphatase: 61 U/L (ref 38–126)
Bilirubin, Direct: 0.1 mg/dL (ref 0.0–0.2)
Indirect Bilirubin: 0.6 mg/dL (ref 0.3–0.9)
Total Bilirubin: 0.7 mg/dL (ref 0.3–1.2)
Total Protein: 7.6 g/dL (ref 6.5–8.1)

## 2019-08-11 LAB — BASIC METABOLIC PANEL
Anion gap: 11 (ref 5–15)
BUN: 19 mg/dL (ref 6–20)
CO2: 21 mmol/L — ABNORMAL LOW (ref 22–32)
Calcium: 9.4 mg/dL (ref 8.9–10.3)
Chloride: 106 mmol/L (ref 98–111)
Creatinine, Ser: 0.6 mg/dL — ABNORMAL LOW (ref 0.61–1.24)
GFR calc Af Amer: >60 mL/min (ref >60)
GFR calc non Af Amer: >60 mL/min (ref >60)
Glucose, Bld: 149 mg/dL — ABNORMAL HIGH (ref 70–99)
Potassium: 4 mmol/L (ref 3.5–5.1)
Sodium: 138 mmol/L (ref 135–145)

## 2019-08-11 LAB — SARS CORONAVIRUS 2 (TAT 6-24 HRS): SARS Coronavirus 2: NEGATIVE

## 2019-08-12 DIAGNOSIS — M160 Bilateral primary osteoarthritis of hip: Secondary | ICD-10-CM | POA: Diagnosis not present

## 2019-08-12 NOTE — H&P (Signed)
HIP ARTHROPLASTY ADMISSION H&P  Patient ID: Steve BrighamMichael S Camero MRN: [id number] DOB/AGE: [birth year] 58 y.o.  Chief Complaint: left hip pain.  Planned Procedure Date: 09/01/19 Medical Clearance by Ouida SillsFagan   Additional clearance by Dr. Nickola MajorHawkes, rheumatology.  Dr. Vear ClockPhillips, PM&R   HPI: Steve Mccormick is a 58 y.o. male with a history of BPH, RA, DM, GERD, chronic back pain who presents for evaluation of OSTEOARTHRITIS LEFT HIP. The patient has a history of pain and functional disability in the left hip due to arthritis and has failed non-surgical conservative treatments for greater than 12 weeks to include NSAID's and/or analgesics, corticosteriod injections and activity modification.  Onset of symptoms was gradual, starting 6 years ago with gradually worsening course since that time.  Patient currently rates pain at 8 out of 10 with activity. Patient has night pain, worsening of pain with activity and weight bearing, pain that interferes with activities of daily living and pain with passive range of motion.  Patient has evidence of subchondral cysts, periarticular osteophytes and joint space narrowing on XR.  MRI shows Bilateral L>R OA, paralabral cysts, labral tearing.  There is no active infection.  Past Medical History:  Diagnosis Date  . Diabetes mellitus   . Fatty liver   . GERD (gastroesophageal reflux disease)   . Hypertension   . Psoriatic arthritis (HCC)    psoriatic and RA   Past Surgical History:  Procedure Laterality Date  . ANKLE SURGERY     bilateral  . COLONOSCOPY N/A 10/22/2012   Dr. Jena Gaussourk: colonic diverticulosis. [facility]  . Ileocolonoscopy  10/02/2007   RMR: Friable anal canal, otherwise normal rectum.  Left-sided diverticula.  The colonic mucosa and  terminal ileum mucosa appeared normal rectum, left-sided diverticula  . KNEE SURGERY     bilateral  . plantar fasciitis Bilateral   . SHOULDER SURGERY     bilateral   Allergies   Allergen Reactions  . Fish Allergy Anaphylaxis, Swelling and Other (See Comments)    Shaky   . Percocet [Oxycodone-Acetaminophen] Itching   *Tolerates oxycodone   Prior to Admission medications   Medication Sig Start Date End Date Taking? Authorizing Provider  canagliflozin (INVOKANA) 300 MG TABS tablet Take 300 mg by mouth daily before breakfast.    [provider]  celecoxib (CELEBREX) 200 MG capsule Take 200 mg by mouth 2 (two) times daily.    [provider]  cetirizine (ZYRTEC) 10 MG tablet Take 10 mg by mouth daily. Allergies     [provider]  diclofenac Sodium (VOLTAREN) 1 % GEL Apply 1 application topically 4 (four) times daily as needed (hand pain).    [provider]  dicyclomine (BENTYL) 10 MG capsule Take one capsule 30 minutes before a meal and at bedtime up to four times daily for cramps/diarrhea. Hold for constipation Patient taking differently: Take 10 mg by mouth 4 (four) times daily as needed (cramps/diarrhea). Hold for constipation 05/26/19   Tiffany KocherLewis, Leslie S, PA-C  gabapentin (NEURONTIN) 400 MG capsule Take 400 mg by mouth 4 (four) times daily.     [provider]  glipiZIDE (GLUCOTROL XL) 10 MG 24 hr tablet Take 10 mg by mouth daily. 06/19/19   [provider]  HYDROcodone-acetaminophen (NORCO) 7.5-325 MG tablet Take 1 tablet by mouth 4 (four) times daily as needed (pain.).     [provider]  inFLIXimab (REMICADE IV) Inject 1 Dose into the vein every 8 (eight)  weeks.     [provider]  lisinopril (ZESTRIL) 10 MG tablet Take 10 mg by mouth daily. 06/23/19   [provider]  metFORMIN (GLUCOPHAGE) 1000 MG tablet Take 1,000 mg by mouth 2 (two) times daily. 06/23/19   [provider]  methocarbamol (ROBAXIN) 750 MG tablet Take 750 mg by mouth 4 (four) times daily as needed for muscle spasms.     [provider]  Multiple Vitamin (MULTIVITAMIN WITH MINERALS) TABS tablet Take  1 tablet by mouth every evening.    [provider]  pantoprazole (PROTONIX) 40 MG tablet Take 1 tablet (40 mg total) by mouth daily before breakfast. 05/26/19   Tiffany Kocher, PA-C  tamsulosin (FLOMAX) 0.4 MG CAPS capsule Take 0.4 mg by mouth daily after supper.    [provider]  VICTOZA 18 MG/3ML SOPN Inject 1.8 mg into the skin every evening. 07/16/19   [provider]   Social History: Married non smoker.  No alcohol use.  Retired from [employer].   Family History  Problem Relation Age of Onset  . Colon cancer Maternal Grandmother   . Colon polyps Mother   . Diabetes Mother   . Hypertension Mother   . Hyperlipidemia Mother   . Diabetes Father     ROS: Currently denies lightheadedness, dizziness, Fever, chills, CP, SOB.   No personal history of DVT, PE, MI, or CVA. No loose teeth or dentures. All other systems have been reviewed and were otherwise currently negative with the exception of those mentioned in the HPI and as above.  Objective: Vitals: Ht: 5'11 Wt: 245 Temp: 97.7 BP: 126/71 Pulse: 86 O2 98% on room air.   Physical Exam: General: Alert, NAD. Trendelenberg Gait  HEENT: EOMI, Good Neck Extension  Pulm: No increased work of breathing.  Clear B/L A/P w/o crackle or wheeze.  CV: RRR, No m/g/r appreciated  GI: soft, NT, ND Neuro: Neuro without gross focal deficit.  Sensation intact distally Skin: No lesions in the area of chief complaint MSK/Surgical Site: Left Hip pain with passive ROM.  Positive Stinchfield.  5/5 strength.  NVI.    Imaging Review Plain radiographs demonstrate severe degenerative joint disease of the bilateral hip.   Preoperative templating of the joint replacement has been completed, documented, and submitted to the Operating Room personnel in order to optimize intra-operative equipment management.  Assessment: OSTEOARTHRITIS LEFT HIP Principal Problem:   Primary osteoarthritis of left hip Active Problems:    Rheumatoid arthritis (HCC)   GERD (gastroesophageal reflux disease)   Diabetes mellitus   Hypertension   Plan: Plan for Procedure(s): TOTAL HIP ARTHROPLASTY ANTERIOR APPROACH  The patient history, physical exam, clinical judgement of the provider and imaging are consistent with end stage degenerative joint disease and total joint arthroplasty is deemed medically necessary. The treatment options including medical management, injection therapy, and arthroplasty were discussed at length. The risks and benefits of Procedure(s): TOTAL HIP ARTHROPLASTY ANTERIOR APPROACH were presented and reviewed.  The risks of nonoperative treatment, versus surgical intervention including but not limited to continued pain, aseptic loosening, stiffness, dislocation/subluxation, infection, bleeding, nerve injury, blood clots, cardiopulmonary complications, morbidity, mortality, among others were discussed. The patient verbalizes understanding and wishes to proceed with the plan.  Patient is being admitted for surgery, pain control, PT, prophylactic antibiotics, VTE prophylaxis, progressive ambulation, ADL's and discharge planning.   Dental prophylaxis discussed and recommended for 2 years postoperatively.   The patient does meet the criteria for TXA which will be used  perioperatively.    ASA 81 mg BID will be used postoperatively for DVT prophylaxis in addition to SCDs, and early ambulation.  Plan for oxycodone for breakthrough pain.  Continue chronic Norco 7.5/325 TID, Celebrex, Gabapentin, Robaxin and pantoprazole.  The patient is planning to be discharged home with [place] in care of his wife.  Anticipated LOS less than 2 midnights.  Insurance approval for inpatient.  - Obesity  - Expected need for hospital services (PT, OT, Nursing) required for safe  discharge  - Anticipated need for postoperative skilled nursing care or inpatient rehab  - Active co-morbidities: Chronic pain requiring  opiods and Diabetes    Prudencio Burly III, PA-C 08/12/2019 9:44 AM

## 2019-08-13 ENCOUNTER — Ambulatory Visit (HOSPITAL_COMMUNITY): Payer: Medicare Other | Admitting: Anesthesiology

## 2019-08-13 ENCOUNTER — Encounter (HOSPITAL_COMMUNITY): Payer: Self-pay | Admitting: *Deleted

## 2019-08-13 ENCOUNTER — Ambulatory Visit (HOSPITAL_COMMUNITY)
Admission: RE | Admit: 2019-08-13 | Discharge: 2019-08-13 | Disposition: A | Discharge status: Home / Self Care | Payer: Medicare Other | Attending: Internal Medicine | Admitting: Internal Medicine

## 2019-08-13 ENCOUNTER — Encounter (HOSPITAL_COMMUNITY)
Admission: RE | Disposition: A | Discharge status: Home / Self Care | Payer: Self-pay | Source: Home / Self Care | Attending: Internal Medicine

## 2019-08-13 DIAGNOSIS — I10 Essential (primary) hypertension: Secondary | ICD-10-CM | POA: Diagnosis not present

## 2019-08-13 DIAGNOSIS — K5730 Diverticulosis of large intestine without perforation or abscess without bleeding: Secondary | ICD-10-CM | POA: Insufficient documentation

## 2019-08-13 DIAGNOSIS — Z8371 Family history of colonic polyps: Secondary | ICD-10-CM

## 2019-08-13 DIAGNOSIS — K317 Polyp of stomach and duodenum: Secondary | ICD-10-CM | POA: Insufficient documentation

## 2019-08-13 DIAGNOSIS — K219 Gastro-esophageal reflux disease without esophagitis: Secondary | ICD-10-CM

## 2019-08-13 DIAGNOSIS — Z1211 Encounter for screening for malignant neoplasm of colon: Secondary | ICD-10-CM | POA: Diagnosis not present

## 2019-08-13 DIAGNOSIS — Z86010 Personal history of colonic polyps: Secondary | ICD-10-CM

## 2019-08-13 DIAGNOSIS — M1990 Unspecified osteoarthritis, unspecified site: Secondary | ICD-10-CM | POA: Insufficient documentation

## 2019-08-13 DIAGNOSIS — E119 Type 2 diabetes mellitus without complications: Secondary | ICD-10-CM | POA: Diagnosis not present

## 2019-08-13 DIAGNOSIS — Z7984 Long term (current) use of oral hypoglycemic drugs: Secondary | ICD-10-CM | POA: Diagnosis not present

## 2019-08-13 DIAGNOSIS — R198 Other specified symptoms and signs involving the digestive system and abdomen: Secondary | ICD-10-CM

## 2019-08-13 DIAGNOSIS — Q438 Other specified congenital malformations of intestine: Secondary | ICD-10-CM | POA: Diagnosis not present

## 2019-08-13 DIAGNOSIS — Z79899 Other long term (current) drug therapy: Secondary | ICD-10-CM | POA: Diagnosis not present

## 2019-08-13 DIAGNOSIS — Z800 Family history of malignant neoplasm of digestive organs: Secondary | ICD-10-CM

## 2019-08-13 DIAGNOSIS — R1013 Epigastric pain: Secondary | ICD-10-CM

## 2019-08-13 DIAGNOSIS — K228 Other specified diseases of esophagus: Secondary | ICD-10-CM | POA: Diagnosis not present

## 2019-08-13 DIAGNOSIS — Z09 Encounter for follow-up examination after completed treatment for conditions other than malignant neoplasm: Secondary | ICD-10-CM | POA: Diagnosis not present

## 2019-08-13 HISTORY — PX: ESOPHAGOGASTRODUODENOSCOPY (EGD) WITH PROPOFOL: SHX5813

## 2019-08-13 HISTORY — PX: BIOPSY: SHX5522

## 2019-08-13 HISTORY — PX: COLONOSCOPY WITH PROPOFOL: SHX5780

## 2019-08-13 LAB — GLUCOSE, CAPILLARY
Glucose-Capillary: 128 mg/dL — ABNORMAL HIGH (ref 70–99)
Glucose-Capillary: 139 mg/dL — ABNORMAL HIGH (ref 70–99)

## 2019-08-13 SURGERY — COLONOSCOPY WITH PROPOFOL
Anesthesia: General

## 2019-08-13 MED ORDER — PROPOFOL 10 MG/ML IV BOLUS
INTRAVENOUS | Status: AC
  Filled 2019-08-13: qty 40

## 2019-08-13 MED ORDER — LIDOCAINE HCL (CARDIAC) PF 100 MG/5ML IV SOSY
PREFILLED_SYRINGE | INTRAVENOUS | Status: DC | PRN
  Administered 2019-08-13: 100 mg via INTRAVENOUS

## 2019-08-13 MED ORDER — MIDAZOLAM HCL 2 MG/2ML IJ SOLN: 0.5000 mg | Freq: Once | INTRAMUSCULAR | Status: DC | PRN

## 2019-08-13 MED ORDER — LACTATED RINGERS IV SOLN
INTRAVENOUS | Status: DC
  Administered 2019-08-13: 1000 mL via INTRAVENOUS

## 2019-08-13 MED ORDER — PROMETHAZINE HCL 25 MG/ML IJ SOLN: 6.2500 mg | INTRAMUSCULAR | Status: DC | PRN

## 2019-08-13 MED ORDER — KETAMINE HCL 10 MG/ML IJ SOLN
INTRAMUSCULAR | Status: DC | PRN
  Administered 2019-08-13: 30 mg via INTRAVENOUS

## 2019-08-13 MED ORDER — LIDOCAINE 2% (20 MG/ML) 5 ML SYRINGE
INTRAMUSCULAR | Status: AC
  Filled 2019-08-13: qty 5

## 2019-08-13 MED ORDER — CHLORHEXIDINE GLUCONATE CLOTH 2 % EX PADS: 6.0000 | MEDICATED_PAD | Freq: Once | CUTANEOUS | Status: DC

## 2019-08-13 MED ORDER — PROPOFOL 10 MG/ML IV BOLUS
INTRAVENOUS | Status: DC | PRN
  Administered 2019-08-13 (×2): 50 mg via INTRAVENOUS

## 2019-08-13 MED ORDER — KETAMINE HCL 50 MG/5ML IJ SOSY
PREFILLED_SYRINGE | INTRAMUSCULAR | Status: AC
  Filled 2019-08-13: qty 5

## 2019-08-13 MED ORDER — PROPOFOL 500 MG/50ML IV EMUL
INTRAVENOUS | Status: DC | PRN
  Administered 2019-08-13: 150 ug/kg/min via INTRAVENOUS

## 2019-08-13 NOTE — Discharge Instructions (Signed)
Colonoscopy Discharge Instructions  Read the instructions outlined below and refer to this sheet in the next few weeks. These discharge instructions provide you with general information on caring for yourself after you leave the hospital. Your doctor may also give you specific instructions. While your treatment has been planned according to the most current medical practices available, unavoidable complications occasionally occur. If you have any problems or questions after discharge, call Dr. Gala Romney at [phone]. ACTIVITY  You may resume your regular activity, but move at a slower pace for the next 24 hours.   Take frequent rest periods for the next 24 hours.   Walking will help get rid of the air and reduce the bloated feeling in your belly (abdomen).   No driving for 24 hours (because of the medicine (anesthesia) used during the test).    Do not sign any important legal documents or operate any machinery for 24 hours (because of the anesthesia used during the test).  NUTRITION  Drink plenty of fluids.   You may resume your normal diet as instructed by your doctor.   Begin with a light meal and progress to your normal diet. Heavy or fried foods are harder to digest and may make you feel sick to your stomach (nauseated).   Avoid alcoholic beverages for 24 hours or as instructed.  MEDICATIONS  You may resume your normal medications unless your doctor tells you otherwise.  WHAT YOU CAN EXPECT TODAY  Some feelings of bloating in the abdomen.   Passage of more gas than usual.   Spotting of blood in your stool or on the toilet paper.  IF YOU HAD POLYPS REMOVED DURING THE COLONOSCOPY:  No aspirin products for 7 days or as instructed.   No alcohol for 7 days or as instructed.   Eat a soft diet for the next 24 hours.  FINDING OUT THE RESULTS OF YOUR TEST Not all test results are available during your visit. If your test results are not back during the visit, make an appointment  with your caregiver to find out the results. Do not assume everything is normal if you have not heard from your caregiver or the medical facility. It is important for you to follow up on all of your test results.  SEEK IMMEDIATE MEDICAL ATTENTION IF:  You have more than a spotting of blood in your stool.   Your belly is swollen (abdominal distention).   You are nauseated or vomiting.   You have a temperature over 101.   You have abdominal pain or discomfort that is severe or gets worse throughout the day.   \ EGD Discharge instructions Please read the instructions outlined below and refer to this sheet in the next few weeks. These discharge instructions provide you with general information on caring for yourself after you leave the hospital. Your doctor may also give you specific instructions. While your treatment has been planned according to the most current medical practices available, unavoidable complications occasionally occur. If you have any problems or questions after discharge, please call your doctor. ACTIVITY  You may resume your regular activity but move at a slower pace for the next 24 hours.   Take frequent rest periods for the next 24 hours.   Walking will help expel (get rid of) the air and reduce the bloated feeling in your abdomen.   No driving for 24 hours (because of the anesthesia (medicine) used during the test).   You may shower.   Do not sign  any important legal documents or operate any machinery for 24 hours (because of the anesthesia used during the test).  NUTRITION  Drink plenty of fluids.   You may resume your normal diet.   Begin with a light meal and progress to your normal diet.   Avoid alcoholic beverages for 24 hours or as instructed by your caregiver.  MEDICATIONS  You may resume your normal medications unless your caregiver tells you otherwise.  WHAT YOU CAN EXPECT TODAY  You may experience abdominal discomfort such as a feeling of  fullness or gas pains.  FOLLOW-UP  Your doctor will discuss the results of your test with you.  SEEK IMMEDIATE MEDICAL ATTENTION IF ANY OF THE FOLLOWING OCCUR:  Excessive nausea (feeling sick to your stomach) and/or vomiting.   Severe abdominal pain and distention (swelling).   Trouble swallowing.   Temperature over 101 F (37.8 C).   Rectal bleeding or vomiting of blood.   Diverticulosis information provided  GERD information provided  Continue Protonix 40 mg daily  Begin Benefiber 1 tablespoon daily for 3 weeks; then increase to 2 tablespoons daily thereafter  Further recommendations to follow pending review of pathology report  Gastroesophageal Reflux Disease, Adult Gastroesophageal reflux (GER) happens when acid from the stomach flows up into the tube that connects the mouth and the stomach (esophagus). Normally, food travels down the esophagus and stays in the stomach to be digested. With GER, food and stomach acid sometimes move back up into the esophagus. You may have a disease called gastroesophageal reflux disease (GERD) if the reflux: Happens often. Causes frequent or very bad symptoms. Causes problems such as damage to the esophagus. When this happens, the esophagus becomes sore and swollen (inflamed). Over time, GERD can make small holes (ulcers) in the lining of the esophagus. What are the causes? This condition is caused by a problem with the muscle between the esophagus and the stomach. When this muscle is weak or not normal, it does not close properly to keep food and acid from coming back up from the stomach. The muscle can be weak because of: Tobacco use. Pregnancy. Having a certain type of hernia (hiatal hernia). Alcohol use. Certain foods and drinks, such as coffee, chocolate, onions, and peppermint. What increases the risk? You are more likely to develop this condition if you: Are overweight. Have a disease that affects your connective tissue. Use  NSAID medicines. What are the signs or symptoms? Symptoms of this condition include: Heartburn. Difficult or painful swallowing. The feeling of having a lump in the throat. A bitter taste in the mouth. Bad breath. Having a lot of saliva. Having an upset or bloated stomach. Belching. Chest pain. Different conditions can cause chest pain. Make sure you see your doctor if you have chest pain. Shortness of breath or noisy breathing (wheezing). Ongoing (chronic) cough or a cough at night. Wearing away of the surface of teeth (tooth enamel). Weight loss. How is this treated? Treatment will depend on how bad your symptoms are. Your doctor may suggest: Changes to your diet. Medicine. Surgery. Follow these instructions at home: Eating and drinking  Follow a diet as told by your doctor. You may need to avoid foods and drinks such as: Coffee and tea (with or without caffeine). Drinks that contain alcohol. Energy drinks and sports drinks. Bubbly (carbonated) drinks or sodas. Chocolate and cocoa. Peppermint and mint flavorings. Garlic and onions. Horseradish. Spicy and acidic foods. These include peppers, chili powder, curry powder, vinegar, hot sauces, and  BBQ sauce. Citrus fruit juices and citrus fruits, such as oranges, lemons, and limes. Tomato-based foods. These include red sauce, chili, salsa, and pizza with red sauce. Fried and fatty foods. These include donuts, french fries, potato chips, and high-fat dressings. High-fat meats. These include hot dogs, rib eye steak, sausage, ham, and bacon. High-fat dairy items, such as whole milk, butter, and cream cheese. Eat small meals often. Avoid eating large meals. Avoid drinking large amounts of liquid with your meals. Avoid eating meals during the 2-3 hours before bedtime. Avoid lying down right after you eat. Do not exercise right after you eat. Lifestyle  Do not use any products that contain nicotine or tobacco. These include  cigarettes, e-cigarettes, and chewing tobacco. If you need help quitting, ask your doctor. Try to lower your stress. If you need help doing this, ask your doctor. If you are overweight, lose an amount of weight that is healthy for you. Ask your doctor about a safe weight loss goal. General instructions Pay attention to any changes in your symptoms. Take over-the-counter and prescription medicines only as told by your doctor. Do not take aspirin, ibuprofen, or other NSAIDs unless your doctor says it is okay. Wear loose clothes. Do not wear anything tight around your waist. Raise (elevate) the head of your bed about 6 inches (15 cm). Avoid bending over if this makes your symptoms worse. Keep all follow-up visits as told by your doctor. This is important. Contact a doctor if: You have new symptoms. You lose weight and you do not know why. You have trouble swallowing or it hurts to swallow. You have wheezing or a cough that keeps happening. Your symptoms do not get better with treatment. You have a hoarse voice. Get help right away if: You have pain in your arms, neck, jaw, teeth, or back. You feel sweaty, dizzy, or light-headed. You have chest pain or shortness of breath. You throw up (vomit) and your throw-up looks like blood or coffee grounds. You pass out (faint). Your poop (stool) is bloody or black. You cannot swallow, drink, or eat. Summary If a person has gastroesophageal reflux disease (GERD), food and stomach acid move back up into the esophagus and cause symptoms or problems such as damage to the esophagus. Treatment will depend on how bad your symptoms are. Follow a diet as told by your doctor. Take all medicines only as told by your doctor. This information is not intended to replace advice given to you by your health care provider. Make sure you discuss any questions you have with your health care provider. Document Released: 02/13/2008 Document Revised: 03/05/2018 Document  Reviewed: 03/05/2018 Elsevier Patient Education  2020 [employer].  Diverticulosis  Diverticulosis is a condition that develops when small pouches (diverticula) form in the wall of the large intestine (colon). The colon is where water is absorbed and stool is formed. The pouches form when the inside layer of the colon pushes through weak spots in the outer layers of the colon. You may have a few pouches or many of them. What are the causes? The cause of this condition is not known. What increases the risk? The following factors may make you more likely to develop this condition: Being older than age 53. Your risk for this condition increases with age. Diverticulosis is rare among people younger than age 35. By age 22, many people have it. Eating a low-fiber diet. Having frequent constipation. Being overweight. Not getting enough exercise. Smoking. Taking over-the-counter pain medicines, like  aspirin and ibuprofen. Having a family history of diverticulosis. What are the signs or symptoms? In most people, there are no symptoms of this condition. If you do have symptoms, they may include: Bloating. Cramps in the abdomen. Constipation or diarrhea. Pain in the lower left side of the abdomen. How is this diagnosed? This condition is most often diagnosed during an exam for other colon problems. Because diverticulosis usually has no symptoms, it often cannot be diagnosed independently. This condition may be diagnosed by: Using a flexible scope to examine the colon (colonoscopy). Taking an X-ray of the colon after dye has been put into the colon (barium enema). Doing a CT scan. How is this treated? You may not need treatment for this condition if you have never developed an infection related to diverticulosis. If you have had an infection before, treatment may include: Eating a high-fiber diet. This may include eating more fruits, vegetables, and grains. Taking a fiber supplement. Taking a  live bacteria supplement (probiotic). Taking medicine to relax your colon. Taking antibiotic medicines. Follow these instructions at home: Drink 6-8 glasses of water or more each day to prevent constipation. Try not to strain when you have a bowel movement. If you have had an infection before: Eat more fiber as directed by your health care provider or your diet and nutrition specialist (dietitian). Take a fiber supplement or probiotic, if your health care provider approves. Take over-the-counter and prescription medicines only as told by your health care provider. If you were prescribed an antibiotic, take it as told by your health care provider. Do not stop taking the antibiotic even if you start to feel better. Keep all follow-up visits as told by your health care provider. This is important. Contact a health care provider if: You have pain in your abdomen. You have bloating. You have cramps. You have not had a bowel movement in 3 days. Get help right away if: Your pain gets worse. Your bloating becomes very bad. You have a fever or chills, and your symptoms suddenly get worse. You vomit. You have bowel movements that are bloody or black. You have bleeding from your rectum. Summary Diverticulosis is a condition that develops when small pouches (diverticula) form in the wall of the large intestine (colon). You may have a few pouches or many of them. This condition is most often diagnosed during an exam for other colon problems. If you have had an infection related to diverticulosis, treatment may include increasing the fiber in your diet, taking supplements, or taking medicines. This information is not intended to replace advice given to you by your health care provider. Make sure you discuss any questions you have with your health care provider. Document Released: 05/24/2004 Document Revised: 08/09/2017 Document Reviewed: 07/16/2016 Elsevier Patient Education  2020 [employer].  Office visit with [place] in 3 months -with Tana [place] on March 3rd at 9:00am  At patient request, I called Debbie, left message on voicemail

## 2019-08-13 NOTE — H&P (Signed)
@LOGO @   Primary Care Physician:  Asencion Noble, MD Primary Gastroenterologist:  Dr. Gala Romney  Pre-Procedure History & Physical: HPI:  Steve Mccormick is a 58 y.o. male here for colonoscopy given prior history of colon polyps and change in bowel habits recently.  Also longstanding GERD but no dysphagia.  Here for an EGD as well.  Past Medical History:  Diagnosis Date  . Diabetes mellitus   . Fatty liver   . GERD (gastroesophageal reflux disease)   . Hypertension   . Psoriatic arthritis ([place])    psoriatic and RA    Past Surgical History:  Procedure Laterality Date  . ANKLE SURGERY     bilateral  . COLONOSCOPY N/A 10/22/2012   Dr. Gala Romney: colonic diverticulosis. next TCS in 5 years due to [facility] CRC, colon polyps  . Ileocolonoscopy  10/02/2007   RMR: Friable anal canal, otherwise normal rectum.  Left-sided diverticula.  The colonic mucosa and  terminal ileum mucosa appeared normal rectum, left-sided diverticula  . KNEE SURGERY     bilateral  . plantar fasciitis Bilateral   . SHOULDER SURGERY     bilateral    Prior to Admission medications   Medication Sig Start Date End Date Taking? Authorizing Provider  canagliflozin (INVOKANA) 300 MG TABS tablet Take 300 mg by mouth daily before breakfast.   Yes [provider]  celecoxib (CELEBREX) 200 MG capsule Take 200 mg by mouth 2 (two) times daily.   Yes [provider]  cetirizine (ZYRTEC) 10 MG tablet Take 10 mg by mouth daily. Allergies    Yes [provider]  diclofenac Sodium (VOLTAREN) 1 % GEL Apply 1 application topically 4 (four) times daily as needed (hand pain).   Yes [provider]  dicyclomine (BENTYL) 10 MG capsule Take one capsule 30 minutes before a meal and at bedtime up to four times daily for cramps/diarrhea. Hold for constipation Patient taking differently: Take 10 mg by mouth 4 (four) times daily as needed (cramps/diarrhea). Hold for constipation 05/26/19  Yes Mahala Menghini, PA-C   gabapentin (NEURONTIN) 400 MG capsule Take 400 mg by mouth 4 (four) times daily.    Yes [provider]  glipiZIDE (GLUCOTROL XL) 10 MG 24 hr tablet Take 10 mg by mouth daily. 06/19/19  Yes [provider]  HYDROcodone-acetaminophen (NORCO) 7.5-325 MG tablet Take 1 tablet by mouth 4 (four) times daily as needed (pain.).    Yes [provider]  inFLIXimab (REMICADE IV) Inject 1 Dose into the vein every 8 (eight) weeks.    Yes [provider]  lisinopril (ZESTRIL) 10 MG tablet Take 10 mg by mouth daily. 06/23/19  Yes [provider]  metFORMIN (GLUCOPHAGE) 1000 MG tablet Take 1,000 mg by mouth 2 (two) times daily. 06/23/19  Yes [provider]  methocarbamol (ROBAXIN) 750 MG tablet Take 750 mg by mouth 4 (four) times daily as needed for muscle spasms.    Yes [provider]  Multiple Vitamin (MULTIVITAMIN WITH MINERALS) TABS tablet Take 1 tablet by mouth every evening.   Yes [provider]  pantoprazole (PROTONIX) 40 MG tablet Take 1 tablet (40 mg total) by mouth daily before breakfast. 05/26/19  Yes Mahala Menghini, PA-C  tamsulosin (FLOMAX) 0.4 MG CAPS capsule Take 0.4 mg by mouth daily after supper.   Yes [provider]  VICTOZA 18 MG/3ML SOPN Inject 1.8 mg into the skin every evening. 07/16/19  Yes [provider]    Allergies as of 05/28/2019 -  Review Complete 05/26/2019  Allergen Reaction Noted  . Fish allergy Anaphylaxis, Swelling, and Other (See Comments) 10/08/2012  . Percocet [oxycodone-acetaminophen] Itching 02/12/2014    Family History  Problem Relation Age of Onset  . Colon cancer Maternal Grandmother   . Colon polyps Mother   . Diabetes Mother   . Hypertension Mother   . Hyperlipidemia Mother   . Diabetes Father     Social History   Socioeconomic History  . Marital status: Married    Spouse name: Not on file  . Number of children: Not on file  . Years of education: Not on file  .  Highest education level: Not on file  Occupational History  . Occupation: Training and development officer: [employer]  Social Needs  . Financial resource strain: Not on file  . Food insecurity    Worry: Not on file    Inability: Not on file  . Transportation needs    Medical: Not on file    Non-medical: Not on file  Tobacco Use  . Smoking status: Never Smoker  . Smokeless tobacco: Never Used  Substance and Sexual Activity  . Alcohol use: No  . Drug use: No  . Sexual activity: Yes  Lifestyle  . Physical activity    Days per week: Not on file    Minutes per session: Not on file  . Stress: Not on file  Relationships  . Social Musician on phone: Not on file    Gets together: Not on file    Attends religious service: Not on file    Active member of club or organization: Not on file    Attends meetings of clubs or organizations: Not on file    Relationship status: Not on file  . Intimate partner violence    Fear of current or ex partner: Not on file    Emotionally abused: Not on file    Physically abused: Not on file    Forced sexual activity: Not on file  Other Topics Concern  . Not on file  Social History Narrative  . Not on file    Review of Systems: See HPI, otherwise negative ROS  Physical Exam: BP 126/74   Temp 97.9 F (36.6 C) (Oral)   Resp 18   SpO2 97%  General:   Alert,  Well-developed, well-nourished, pleasant and cooperative in NAD Neck:  Supple; no masses or thyromegaly. No significant cervical adenopathy. Lungs:  Clear throughout to auscultation.   No wheezes, crackles, or rhonchi. No acute distress. Heart:  Regular rate and rhythm; no murmurs, clicks, rubs,  or gallops. Abdomen: Non-distended, normal bowel sounds.  Soft and nontender without appreciable mass or hepatosplenomegaly.  Pulses:  Normal pulses noted. Extremities:  Without clubbing or edema.  Impression/Plan: 58 year old gentleman with longstanding GERD-now much improved on  Protonix 40 mg once daily for colonic polyps.  Alternate constipation and diarrhea.  Here also for a surveillance colonoscopy per plan.  He does take Norco every day. The risks, benefits, limitations, imponderables and alternatives regarding both EGD and colonoscopy have been reviewed with the patient. Questions have been answered. All parties agreeable.      Notice: This dictation was prepared with Dragon dictation along with smaller phrase technology. Any transcriptional errors that result from this process are unintentional and may not be corrected upon review.

## 2019-08-13 NOTE — Op Note (Signed)
[facility] Patient Name: Steve Mccormick Procedure Date: 08/13/2019 7:10 AM MRN: [id number] Date of Birth: 13-Apr-1961 Attending MD: Norvel Richards , MD CSN: [id number] Age: 58 Admit Type: Outpatient Procedure:                Upper GI endoscopy Indications:              Suspected gastro-esophageal reflux disease Providers:                Norvel Richards, MD, Jeanann Lewandowsky. Sharon Seller, RN,                            Aram Candela Referring MD:              Medicines:                Propofol per Anesthesia Complications:            No immediate complications. Estimated Blood Loss:     Estimated blood loss was minimal. Procedure:                Pre-Anesthesia Assessment:                           - Prior to the procedure, a History and Physical                            was performed, and patient medications and                            allergies were reviewed. The patient's tolerance of                            previous anesthesia was also reviewed. The risks                            and benefits of the procedure and the sedation                            options and risks were discussed with the patient.                            All questions were answered, and informed consent                            was obtained. Prior Anticoagulants: The patient has                            taken no previous anticoagulant or antiplatelet                            agents. ASA Grade Assessment: II - A patient with                            mild systemic disease. After reviewing the risks  and benefits, the patient was deemed in                            satisfactory condition to undergo the procedure.                           After obtaining informed consent, the endoscope was                            passed under direct vision. Throughout the                            procedure, the patient's blood pressure, pulse, and      oxygen saturations were monitored continuously. The                            [id number]-[id number] ([id number]) scope was introduced through the                            mouth, and advanced to the second part of duodenum.                            The upper GI endoscopy was accomplished without                            difficulty. The patient tolerated the procedure                            well. Scope In: 7:32:06 AM Scope Out: 7:39:03 AM Total Procedure Duration: 0 hours 6 minutes 57 seconds  Findings:      (2) 1 cm tongues of salmon-colored epithelium coming up above the GE       junction; otherwise the tubular esophagus appeared normal. Biopsies taken      Aside from a single 5 mm pedunculated benign-appearing polyp in the       gastric body, the entire examined stomach was normal. Gastric polyp       removed with cold biopsy.      The duodenal bulb and second portion of the duodenum were normal. Impression:               - Mucosal changes in the esophagus. Status post                            biopsy                           -Gastric polyp?"removed otherwise normal stomach.                           - Normal duodenal bulb and second portion of the                            duodenum.                           - No specimens collected.  Moderate Sedation:      Moderate (conscious) sedation was personally administered by an       anesthesia professional. The following parameters were monitored: oxygen       saturation, heart rate, blood pressure, respiratory rate, EKG, adequacy       of pulmonary ventilation, and response to care. Recommendation:           - Patient has a contact number available for                            emergencies. The signs and symptoms of potential                            delayed complications were discussed with the                            patient. Return to normal activities tomorrow.                            Written discharge instructions were  provided to the                            patient.                           - Advance diet as tolerated.                           - Continue present medications. Continue Protonix                            40 mg daily                           - Await pathology results.                           - Repeat upper endoscopy after studies are complete                            for surveillance based on pathology results.                           - Return to GI clinic in 3 months. See colonoscopy                            report Procedure Code(s):        --- Professional ---                           [id number], Esophagogastroduodenoscopy, flexible,                            transoral; diagnostic, including collection of                            specimen(s) by brushing or washing, when performed                            (  separate procedure) Diagnosis Code(s):        --- Professional ---                           K22.8, Other specified diseases of esophagus CPT copyright 2019 American [employer] Association. All rights reserved. The codes documented in this report are preliminary and upon coder review may  be revised to meet current compliance requirements. Gerrit Friends. Aleka Twitty, MD Gennette Pac, MD 08/13/2019 8:08:40 AM This report has been signed electronically. Number of Addenda: 0

## 2019-08-13 NOTE — Transfer of Care (Signed)
Immediate Anesthesia Transfer of Care Note  Patient: Steve Mccormick  Procedure(s) Performed: COLONOSCOPY WITH PROPOFOL (N/A ) ESOPHAGOGASTRODUODENOSCOPY (EGD) WITH PROPOFOL (N/A ) BIOPSY  Patient Location: PACU  Anesthesia Type:MAC  Level of Consciousness: awake, alert , oriented and patient cooperative  Airway & Oxygen Therapy: Patient Spontanous Breathing and Patient connected to nasal cannula oxygen  Post-op Assessment: Report given to RN, Post -op Vital signs reviewed and stable and Patient moving all extremities X 4  Post vital signs: Reviewed and stable  Last Vitals:  Vitals Value Taken Time  BP    Temp    Pulse 82 08/13/19 0808  Resp 13 08/13/19 0808  SpO2 98 % 08/13/19 0808  Vitals shown include unvalidated device data.  Last Pain:  Vitals:   08/13/19 0731  TempSrc:   PainSc: 0-No pain      Patients Stated Pain Goal: 7 (03/49/17 9150)  Complications: No apparent anesthesia complications

## 2019-08-13 NOTE — Anesthesia Postprocedure Evaluation (Signed)
Anesthesia Post Note  Patient: Steve Mccormick  Procedure(s) Performed: COLONOSCOPY WITH PROPOFOL (N/A ) ESOPHAGOGASTRODUODENOSCOPY (EGD) WITH PROPOFOL (N/A ) BIOPSY  Patient location during evaluation: PACU Anesthesia Type: MAC Level of consciousness: awake, oriented and awake and alert Pain management: pain level controlled Vital Signs Assessment: post-procedure vital signs reviewed and stable Respiratory status: spontaneous breathing, respiratory function stable, nonlabored ventilation and patient connected to nasal cannula oxygen Cardiovascular status: stable Postop Assessment: no apparent nausea or vomiting Anesthetic complications: no     Last Vitals:  Vitals:   08/13/19 0654  BP: 126/74  Resp: 18  Temp: 36.6 C  SpO2: 97%    Last Pain:  Vitals:   08/13/19 0731  TempSrc:   PainSc: 0-No pain                 Verlyn Lambert

## 2019-08-13 NOTE — Op Note (Signed)
[facility] Patient Name: Steve Mccormick Procedure Date: 08/13/2019 7:04 AM MRN: [id number] Date of Birth: [birth year] Attending MD: Norvel Richards , MD CSN: [id number] Age: 58 Admit Type: Outpatient Procedure:                Colonoscopy Indications:              High risk colon cancer surveillance: Personal                            history of colonic polyps Providers:                Norvel Richards, MD, Otis Peak B. Sharon Seller, RN,                            Aram Candela Referring MD:              Medicines:                Propofol per Anesthesia Complications:            No immediate complications. Estimated Blood Loss:     Estimated blood loss: none. Procedure:                Pre-Anesthesia Assessment:                           - Prior to the procedure, a History and Physical                            was performed, and patient medications and                            allergies were reviewed. The patient's tolerance of                            previous anesthesia was also reviewed. The risks                            and benefits of the procedure and the sedation                            options and risks were discussed with the patient.                            All questions were answered, and informed consent                            was obtained. Prior Anticoagulants: The patient has                            taken no previous anticoagulant or antiplatelet                            agents. ASA Grade Assessment: II - A patient with  mild systemic disease. After reviewing the risks                            and benefits, the patient was deemed in                            satisfactory condition to undergo the procedure.                           After obtaining informed consent, the colonoscope                            was passed under direct vision. Throughout the                            procedure, the patient's blood  pressure, pulse, and                            oxygen saturations were monitored continuously. The                            [id number] ([id number]) scope was introduced through                            the anus and advanced to the the cecum, identified                            by appendiceal orifice and ileocecal valve. The                            colonoscopy was performed without difficulty. The                            patient tolerated the procedure well. The quality                            of the bowel preparation was adequate. Anatomical                            landmarks were photographed. The entire colon was                            well visualized. Scope In: 7:43:30 AM Scope Out: 8:01:32 AM Scope Withdrawal Time: 0 hours 7 minutes 19 seconds  Total Procedure Duration: 0 hours 18 minutes 2 seconds  Findings:      The perianal and digital rectal examinations were normal. Long redundant       colon. Required external abdominal pressure to reach the cecum.      Scattered medium-mouthed diverticula were found in the sigmoid colon and       descending colon.      The exam was otherwise without abnormality on direct and retroflexion       views. Impression:               - Diverticulosis in the sigmoid colon  and in the                            descending colon. 100, elongated colon                           - The examination was otherwise normal on direct                            and retroflexion views.                           - No specimens collected. Moderate Sedation:      Moderate (conscious) sedation was personally administered by an       anesthesia professional. The following parameters were monitored: oxygen       saturation, heart rate, blood pressure, respiratory rate, EKG, adequacy       of pulmonary ventilation, and response to care. Recommendation:           - Patient has a contact number available for                            emergencies. The  signs and symptoms of potential                            delayed complications were discussed with the                            patient. Return to normal activities tomorrow.                            Written discharge instructions were provided to the                            patient.                           - Resume previous diet.                           - Continue present medications. Begin Benefiber 1                            tablespoon daily for 3 weeks and increase to 2                            tablespoons daily thereafter                           - Repeat colonoscopy in 5 years for surveillance.                           - Return to GI office in 3 months. See EGD report Procedure Code(s):        --- Professional ---  [id number], Colonoscopy, flexible; diagnostic, including                            collection of specimen(s) by brushing or washing,                            when performed (separate procedure) Diagnosis Code(s):        --- Professional ---                           Z86.010, Personal history of colonic polyps                           K57.30, Diverticulosis of large intestine without                            perforation or abscess without bleeding CPT copyright 2019 American Medical Association. All rights reserved. The codes documented in this report are preliminary and upon coder review may  be revised to meet current compliance requirements. Gerrit Friendsobert M. Kenidee Cregan, MD Gennette Pacobert Shahid Meleni Delahunt, MD 08/13/2019 8:15:26 AM This report has been signed electronically. Number of Addenda: 0

## 2019-08-13 NOTE — Care Plan (Signed)
Ortho Bundle Case Management Note  Patient Details  Name: Steve Mccormick MRN: [id number] Date of Birth: 1961-05-31  Patient seen in the office for H&P visit. Questions answered. Follow up appointments given. He plans to discharge to home with family and go to [place]. Appointment made at [facility] in [place]. He has all needed equipment at home. Patient and MD in agreement with plan. Choice offered.                   DME Arranged:    DME Agency:     HH Arranged:    HH Agency:     Additional Comments: Please contact me with any questions of if this plan should need to change.  Ladell Heads,  [place] [facility]  [phone] 08/13/2019, 3:07 PM

## 2019-08-13 NOTE — Anesthesia Preprocedure Evaluation (Signed)
Anesthesia Evaluation  Patient identified by MRN, date of birth, ID band Patient awake    Reviewed: Allergy & Precautions, NPO status , Patient's Chart, lab work & pertinent test results  Airway Mallampati: II  TM Distance: >3 FB Neck ROM: Full    Dental no notable dental hx. (+) Teeth Intact   Pulmonary neg pulmonary ROS,    Pulmonary exam normal breath sounds clear to auscultation       Cardiovascular Exercise Tolerance: Good hypertension, Pt. on medications negative cardio ROS Normal cardiovascular examI Rhythm:Regular Rate:Normal     Neuro/Psych negative neurological ROS  negative psych ROS   GI/Hepatic Neg liver ROS, Bowel prep,GERD  Medicated and Controlled,Here for EGD/Colon    Endo/Other  negative endocrine ROSdiabetes, Well Controlled, Type 2, Oral Hypoglycemic Agents  Renal/GU negative Renal ROS  negative genitourinary   Musculoskeletal  (+) Arthritis , Rheumatoid disorders,  States has RA and psoriatic arthritis - on chronic pain meds and Remicaide infusions last ~08/03/19  Reports multiple bulging discs in next and spine    Abdominal   Peds negative pediatric ROS (+)  Hematology negative hematology ROS (+)   Anesthesia Other Findings   Reproductive/Obstetrics negative OB ROS                             Anesthesia Physical Anesthesia Plan  ASA: II  Anesthesia Plan: General   Post-op Pain Management:    Induction: Intravenous  PONV Risk Score and Plan: 2 and Propofol infusion, TIVA and Treatment may vary due to age or medical condition  Airway Management Planned: Nasal Cannula and Simple Face Mask  Additional Equipment:   Intra-op Plan:   Post-operative Plan:   Informed Consent: I have reviewed the patients History and Physical, chart, labs and discussed the procedure including the risks, benefits and alternatives for the proposed anesthesia with the patient or  authorized representative who has indicated his/her understanding and acceptance.     Dental advisory given  Plan Discussed with: CRNA  Anesthesia Plan Comments: (Plan Full PPE use  Plan GA with GETA as needed d/w pt -WTP with same after Q&A)        Anesthesia Quick Evaluation

## 2019-08-14 LAB — SURGICAL PATHOLOGY

## 2019-08-17 ENCOUNTER — Encounter (HOSPITAL_COMMUNITY): Payer: Self-pay | Admitting: Internal Medicine

## 2019-08-17 ENCOUNTER — Encounter: Payer: Self-pay | Admitting: Internal Medicine

## 2019-08-20 NOTE — Patient Instructions (Addendum)
DUE TO COVID-19 ONLY ONE VISITOR IS ALLOWED TO COME WITH YOU AND STAY IN THE WAITING ROOM ONLY DURING PRE OP AND PROCEDURE DAY OF SURGERY. THE 1 VISITOR MAY VISIT WITH YOU AFTER SURGERY IN YOUR PRIVATE ROOM DURING VISITING HOURS ONLY!  YOU NEED TO HAVE A COVID 19 TEST ON Friday 08/28/2019 @10 :00 AM, THIS TEST MUST BE DONE BEFORE SURGERY, GO TO [place], [place], [place] INSTRUCTIONS AS OUTLINED IN YOUR HANDOUT.                Graciella Freer    Your procedure is scheduled on: TUESDAY 09/01/2019   Report to [facility] Main  Entrance    Report to Short Stay at 05:30  AM    Call this number if you have problems the morning of surgery [phone]    Remember: Do not eat food  :After Midnight.     NO SOLID FOOD AFTER MIDNIGHT THE NIGHT PRIOR TO SURGERY. NOTHING BY MOUTH EXCEPT CLEAR LIQUIDS UNTIL  04:30  AM .PLEASE FINISH Gatorade G 2  DRINK PER SURGEON ORDER  WHICH NEEDS TO BE COMPLETED AT  04:30 AM.   CLEAR LIQUID DIET   Foods Allowed                                                                     Foods Excluded  Coffee and tea, regular and decaf                             liquids that you cannot  Plain Jell-O any favor except red or purple                                           see through such as: Fruit ices (not with fruit pulp)                                     milk, soups, orange juice  Iced Popsicles                                    All solid food Carbonated beverages, regular and diet                                    Cranberry, grape and apple juices Sports drinks like Gatorade Lightly seasoned clear broth or consume(fat free) Sugar, honey syrup   _____________________________________________________________________    Take these medicines the morning of surgery with A SIP OF WATER: Gabapentin (Neurontin), Pantoprazole (Protonix), and Cetrizine (Zyrtec)  BRUSH YOUR TEETH MORNING OF SURGERY AND RINSE YOUR  MOUTH OUT, NO CHEWING GUM CANDY OR MINTS.    DO NOT TAKE ANY DIABETIC MEDICATIONS DAY OF YOUR SURGERY!  You may not have any metal on your body including hair pins and              piercings     Do not wear jewelry, cologne, lotions, powders or deodorant                          Men may shave face and neck.   Do not bring valuables to the hospital. [facility] IS NOT             RESPONSIBLE   FOR VALUABLES.  Contacts, dentures or bridgework may not be worn into surgery.  Leave suitcase in the car. After surgery it may be brought to your room.                 Please read over the following fact sheets you were given: _____________________________________________________________________  How to Manage Your Diabetes Before and After Surgery  Why is it important to control my blood sugar before and after surgery? . Improving blood sugar levels before and after surgery helps healing and can limit problems. . A way of improving blood sugar control is eating a healthy diet by: o  Eating less sugar and carbohydrates o  Increasing activity/exercise o  Talking with your doctor about reaching your blood sugar goals . High blood sugars (greater than 180 mg/dL) can raise your risk of infections and slow your recovery, so you will need to focus on controlling your diabetes during the weeks before surgery. . Make sure that the doctor who takes care of your diabetes knows about your planned surgery including the date and location.  How do I manage my blood sugar before surgery? . Check your blood sugar at least 4 times a day, starting 2 days before surgery, to make sure that the level is not too high or low. o Check your blood sugar the morning of your surgery when you wake up and every 2 hours until you get to the Short Stay unit. . If your blood sugar is less than 70 mg/dL, you will need to treat for low blood sugar: o Do not take insulin. o Treat a low blood  sugar (less than 70 mg/dL) with  cup of clear juice (cranberry or apple), 4 glucose tablets, OR glucose gel. o Recheck blood sugar in 15 minutes after treatment (to make sure it is greater than 70 mg/dL). If your blood sugar is not greater than 70 mg/dL on recheck, call [phone] for further instructions. . Report your blood sugar to the short stay nurse when you get to Short Stay.  . If you are admitted to the hospital after surgery: o Your blood sugar will be checked by the staff and you will probably be given insulin after surgery (instead of oral diabetes medicines) to make sure you have good blood sugar levels. o The goal for blood sugar control after surgery is 80-180 mg/dL.   WHAT DO I DO ABOUT MY DIABETES MEDICATION?         The day before surgery, DO NOT TAKE Jardiance!         The day before surgery, Take Metformin (Glucophage) as prescribed!         The day before surgery, Take Glipizide (Glucotrol) as prescribed!         The day before surgery, Take Victoza as prescribed!  . Do not take oral diabetes medicines (pills) the morning of surgery.   . The day  of surgery, do not take other diabetes injectables, including Byetta (exenatide), Bydureon (exenatide ER), Victoza (liraglutide), or Trulicity (dulaglutide). [facility] - Preparing for Surgery Before surgery, you can play an important role.  Because skin is not sterile, your skin needs to be as free of germs as possible.  You can reduce the number of germs on your skin by washing with CHG (chlorahexidine gluconate) soap before surgery.  CHG is an antiseptic cleaner which kills germs and bonds with the skin to continue killing germs even after washing. Please DO NOT use if you have an allergy to CHG or antibacterial soaps.  If your skin becomes reddened/irritated stop using the CHG and inform your nurse when you arrive at Short Stay. Do not shave (including legs and underarms) for at least 48 hours  prior to the first CHG shower.  You may shave your face/neck. Please follow these instructions carefully:  1.  Shower with CHG Soap the night before surgery and the  morning of Surgery.  2.  If you choose to wash your hair, wash your hair first as usual with your  normal  shampoo.  3.  After you shampoo, rinse your hair and body thoroughly to remove the  shampoo.                           4.  Use CHG as you would any other liquid soap.  You can apply chg directly  to the skin and wash                       Gently with a scrungie or clean washcloth.  5.  Apply the CHG Soap to your body ONLY FROM THE NECK DOWN.   Do not use on face/ open                           Wound or open sores. Avoid contact with eyes, ears mouth and genitals (private parts).                       Wash face,  Genitals (private parts) with your normal soap.             6.  Wash thoroughly, paying special attention to the area where your surgery  will be performed.  7.  Thoroughly rinse your body with warm water from the neck down.  8.  DO NOT shower/wash with your normal soap after using and rinsing off  the CHG Soap.                9.  Pat yourself dry with a clean towel.            10.  Wear clean pajamas.            11.  Place clean sheets on your bed the night of your first shower and do not  sleep with pets. Day of Surgery : Do not apply any lotions/deodorants the morning of surgery.  Please wear clean clothes to the hospital/surgery center.  FAILURE TO FOLLOW THESE INSTRUCTIONS MAY RESULT IN THE CANCELLATION OF YOUR SURGERY PATIENT SIGNATURE_________________________________  NURSE SIGNATURE__________________________________  ________________________________________________________________________   Rogelia Mire  An incentive spirometer is a tool that can help keep your lungs clear and active. This tool measures  how well you are filling your lungs with each breath. Taking long deep breaths may help reverse  or decrease the chance of developing breathing (pulmonary) problems (especially infection) following:  A long period of time when you are unable to move or be active. BEFORE THE PROCEDURE   If the spirometer includes an indicator to show your best effort, your nurse or respiratory therapist will set it to a desired goal.  If possible, sit up straight or lean slightly forward. Try not to slouch.  Hold the incentive spirometer in an upright position. INSTRUCTIONS FOR USE  1. Sit on the edge of your bed if possible, or sit up as far as you can in bed or on a chair. 2. Hold the incentive spirometer in an upright position. 3. Breathe out normally. 4. Place the mouthpiece in your mouth and seal your lips tightly around it. 5. Breathe in slowly and as deeply as possible, raising the piston or the ball toward the top of the column. 6. Hold your breath for 3-5 seconds or for as long as possible. Allow the piston or ball to fall to the bottom of the column. 7. Remove the mouthpiece from your mouth and breathe out normally. 8. Rest for a few seconds and repeat Steps 1 through 7 at least 10 times every 1-2 hours when you are awake. Take your time and take a few normal breaths between deep breaths. 9. The spirometer may include an indicator to show your best effort. Use the indicator as a goal to work toward during each repetition. 10. After each set of 10 deep breaths, practice coughing to be sure your lungs are clear. If you have an incision (the cut made at the time of surgery), support your incision when coughing by placing a pillow or rolled up towels firmly against it. Once you are able to get out of bed, walk around indoors and cough well. You may stop using the incentive spirometer when instructed by your caregiver.  RISKS AND COMPLICATIONS  Take your time so you do not get dizzy or light-headed.  If you are in pain, you may need to take or ask for pain medication before doing incentive  spirometry. It is harder to take a deep breath if you are having pain. AFTER USE  Rest and breathe slowly and easily.  It can be helpful to keep track of a log of your progress. Your caregiver can provide you with a simple table to help with this. If you are using the spirometer at home, follow these instructions: SEEK MEDICAL CARE IF:   You are having difficultly using the spirometer.  You have trouble using the spirometer as often as instructed.  Your pain medication is not giving enough relief while using the spirometer.  You develop fever of 100.5 F (38.1 C) or higher. SEEK IMMEDIATE MEDICAL CARE IF:   You cough up bloody sputum that had not been present before.  You develop fever of 102 F (38.9 C) or greater.  You develop worsening pain at or near the incision site. MAKE SURE YOU:   Understand these instructions.  Will watch your condition.  Will get help right away if you are not doing well or get worse. Document Released: 01/07/2007 Document Revised: 11/19/2011 Document Reviewed: 03/10/2007 [facility] Patient Information 2014 [place], [place].   ________________________________________________________________________

## 2019-08-22 ENCOUNTER — Other Ambulatory Visit: Payer: Self-pay | Admitting: Gastroenterology

## 2019-08-24 ENCOUNTER — Encounter (HOSPITAL_COMMUNITY)
Admission: RE | Admit: 2019-08-24 | Discharge: 2019-08-24 | Disposition: A | Discharge status: Home / Self Care | Payer: Medicare Other | Source: Ambulatory Visit | Attending: Orthopedic Surgery | Admitting: Orthopedic Surgery

## 2019-08-24 ENCOUNTER — Other Ambulatory Visit: Payer: Self-pay

## 2019-08-24 ENCOUNTER — Encounter (HOSPITAL_COMMUNITY): Payer: Self-pay

## 2019-08-24 DIAGNOSIS — Z01818 Encounter for other preprocedural examination: Secondary | ICD-10-CM | POA: Insufficient documentation

## 2019-08-24 DIAGNOSIS — I10 Essential (primary) hypertension: Secondary | ICD-10-CM | POA: Insufficient documentation

## 2019-08-24 DIAGNOSIS — M1612 Unilateral primary osteoarthritis, left hip: Secondary | ICD-10-CM | POA: Diagnosis not present

## 2019-08-24 LAB — SURGICAL PCR SCREEN
MRSA, PCR: NEGATIVE
Staphylococcus aureus: NEGATIVE

## 2019-08-24 LAB — BASIC METABOLIC PANEL
Anion gap: 12 (ref 5–15)
BUN: 19 mg/dL (ref 6–20)
CO2: 22 mmol/L (ref 22–32)
Calcium: 9.3 mg/dL (ref 8.9–10.3)
Chloride: 104 mmol/L (ref 98–111)
Creatinine, Ser: 0.76 mg/dL (ref 0.61–1.24)
GFR calc Af Amer: >60 mL/min (ref >60)
GFR calc non Af Amer: >60 mL/min (ref >60)
Glucose, Bld: 169 mg/dL — ABNORMAL HIGH (ref 70–99)
Potassium: 4.7 mmol/L (ref 3.5–5.1)
Sodium: 138 mmol/L (ref 135–145)

## 2019-08-24 LAB — CBC
HCT: 44.6 % (ref 39.0–52.0)
Hemoglobin: 14.6 g/dL (ref 13.0–17.0)
MCH: 28.9 pg (ref 26.0–34.0)
MCHC: 32.7 g/dL (ref 30.0–36.0)
MCV: 88.1 fL (ref 80.0–100.0)
Platelets: 251 10*3/uL (ref 150–400)
RBC: 5.06 MIL/uL (ref 4.22–5.81)
RDW: 13.4 % (ref 11.5–15.5)
WBC: 8 10*3/uL (ref 4.0–10.5)
nRBC: 0 % (ref 0.0–0.2)

## 2019-08-24 LAB — HEMOGLOBIN A1C
Hgb A1c MFr Bld: 7.7 % — ABNORMAL HIGH (ref 4.8–5.6)
Mean Plasma Glucose: 174.29 mg/dL

## 2019-08-24 NOTE — Progress Notes (Signed)
PCP - Asencion Noble, MD Cardiologist -   Chest x-ray -  US abdomen limited RUQ  EKG - 08-24-19  Stress Test -  ECHO -  Cardiac Cath -   Sleep Study -  CPAP -   Fasting Blood Sugar -  Checks Blood Sugar _____ times a day  Blood Thinner Instructions: Aspirin Instructions: Last Dose:  Anesthesia review: Hx of HTN, DM  Patient denies shortness of breath, fever, cough and chest pain at PAT appointment   Patient verbalized understanding of instructions that were given to them at the PAT appointment. Patient was also instructed that they will need to review over the PAT instructions again at home before surgery.

## 2019-08-26 DIAGNOSIS — T148XXA Other injury of unspecified body region, initial encounter: Secondary | ICD-10-CM | POA: Diagnosis not present

## 2019-08-28 ENCOUNTER — Other Ambulatory Visit (HOSPITAL_COMMUNITY)
Admission: RE | Admit: 2019-08-28 | Discharge: 2019-08-28 | Disposition: A | Discharge status: Home / Self Care | Payer: Medicare Other | Source: Ambulatory Visit | Attending: Orthopedic Surgery | Admitting: Orthopedic Surgery

## 2019-08-28 ENCOUNTER — Other Ambulatory Visit: Payer: Self-pay

## 2019-08-28 DIAGNOSIS — Z01812 Encounter for preprocedural laboratory examination: Secondary | ICD-10-CM | POA: Diagnosis not present

## 2019-08-28 DIAGNOSIS — Z20828 Contact with and (suspected) exposure to other viral communicable diseases: Secondary | ICD-10-CM | POA: Diagnosis not present

## 2019-08-28 LAB — SARS CORONAVIRUS 2 (TAT 6-24 HRS): SARS Coronavirus 2: NEGATIVE

## 2019-08-29 ENCOUNTER — Other Ambulatory Visit: Payer: Self-pay | Admitting: Gastroenterology

## 2019-08-31 ENCOUNTER — Other Ambulatory Visit: Payer: Self-pay | Admitting: Internal Medicine

## 2019-08-31 MED ORDER — BUPIVACAINE LIPOSOME 1.3 % IJ SUSP
10.0000 mL | Freq: Once | INTRAMUSCULAR | Status: DC
  Filled 2019-08-31: qty 10

## 2019-08-31 NOTE — Anesthesia Preprocedure Evaluation (Addendum)
Anesthesia Evaluation  Patient identified by MRN, date of birth, ID band Patient awake    Reviewed: Allergy & Precautions, NPO status , Patient's Chart, lab work & pertinent test results  History of Anesthesia Complications Negative for: history of anesthetic complications  Airway Mallampati: II  TM Distance: >3 FB Neck ROM: Full    Dental  (+) Dental Advisory Given   Pulmonary neg pulmonary ROS,  08/28/2019 SARS CoV2 neg   breath sounds clear to auscultation       Cardiovascular hypertension, Pt. on medications (-) angina Rhythm:Regular Rate:Normal     Neuro/Psych negative neurological ROS     GI/Hepatic Neg liver ROS, GERD  Medicated and Controlled,  Endo/Other  diabetes (glu 159), Oral Hypoglycemic AgentsMorbid obesity  Renal/GU negative Renal ROS     Musculoskeletal  (+) Arthritis , Osteoarthritis,    Abdominal (+) + obese,   Peds  Hematology   Anesthesia Other Findings   Reproductive/Obstetrics                            Anesthesia Physical Anesthesia Plan  ASA: III  Anesthesia Plan: Spinal   Post-op Pain Management:    Induction:   PONV Risk Score and Plan: 1 and Ondansetron and Dexamethasone  Airway Management Planned: Natural Airway and Simple Face Mask  Additional Equipment:   Intra-op Plan:   Post-operative Plan:   Informed Consent: I have reviewed the patients History and Physical, chart, labs and discussed the procedure including the risks, benefits and alternatives for the proposed anesthesia with the patient or authorized representative who has indicated his/her understanding and acceptance.     Dental advisory given  Plan Discussed with: CRNA and Surgeon  Anesthesia Plan Comments:        Anesthesia Quick Evaluation

## 2019-09-01 ENCOUNTER — Inpatient Hospital Stay (HOSPITAL_COMMUNITY)
Admission: RE | Admit: 2019-09-01 | Discharge: 2019-09-01 | DRG: 470 | Disposition: A | Discharge status: Home / Self Care | Payer: Medicare Other | Attending: Orthopedic Surgery | Admitting: Orthopedic Surgery

## 2019-09-01 ENCOUNTER — Inpatient Hospital Stay (HOSPITAL_COMMUNITY): Payer: Medicare Other | Admitting: Physician Assistant

## 2019-09-01 ENCOUNTER — Encounter (HOSPITAL_COMMUNITY): Payer: Self-pay | Admitting: Orthopedic Surgery

## 2019-09-01 ENCOUNTER — Inpatient Hospital Stay (HOSPITAL_COMMUNITY): Payer: Medicare Other | Admitting: Anesthesiology

## 2019-09-01 ENCOUNTER — Encounter (HOSPITAL_COMMUNITY)
Admission: RE | Disposition: A | Discharge status: Home / Self Care | Payer: Self-pay | Source: Home / Self Care | Attending: Orthopedic Surgery

## 2019-09-01 ENCOUNTER — Inpatient Hospital Stay (HOSPITAL_COMMUNITY): Payer: Medicare Other

## 2019-09-01 ENCOUNTER — Other Ambulatory Visit: Payer: Self-pay

## 2019-09-01 DIAGNOSIS — L4050 Arthropathic psoriasis, unspecified: Secondary | ICD-10-CM | POA: Diagnosis present

## 2019-09-01 DIAGNOSIS — Z91013 Allergy to seafood: Secondary | ICD-10-CM | POA: Diagnosis not present

## 2019-09-01 DIAGNOSIS — K760 Fatty (change of) liver, not elsewhere classified: Secondary | ICD-10-CM | POA: Diagnosis present

## 2019-09-01 DIAGNOSIS — M549 Dorsalgia, unspecified: Secondary | ICD-10-CM | POA: Diagnosis not present

## 2019-09-01 DIAGNOSIS — E669 Obesity, unspecified: Secondary | ICD-10-CM | POA: Diagnosis present

## 2019-09-01 DIAGNOSIS — Z8249 Family history of ischemic heart disease and other diseases of the circulatory system: Secondary | ICD-10-CM

## 2019-09-01 DIAGNOSIS — Z79899 Other long term (current) drug therapy: Secondary | ICD-10-CM

## 2019-09-01 DIAGNOSIS — Z20828 Contact with and (suspected) exposure to other viral communicable diseases: Secondary | ICD-10-CM | POA: Diagnosis present

## 2019-09-01 DIAGNOSIS — I10 Essential (primary) hypertension: Secondary | ICD-10-CM | POA: Diagnosis present

## 2019-09-01 DIAGNOSIS — Z8371 Family history of colonic polyps: Secondary | ICD-10-CM | POA: Diagnosis not present

## 2019-09-01 DIAGNOSIS — Z833 Family history of diabetes mellitus: Secondary | ICD-10-CM | POA: Diagnosis not present

## 2019-09-01 DIAGNOSIS — Z419 Encounter for procedure for purposes other than remedying health state, unspecified: Secondary | ICD-10-CM

## 2019-09-01 DIAGNOSIS — Z79891 Long term (current) use of opiate analgesic: Secondary | ICD-10-CM | POA: Diagnosis not present

## 2019-09-01 DIAGNOSIS — Z86010 Personal history of colonic polyps: Secondary | ICD-10-CM | POA: Diagnosis not present

## 2019-09-01 DIAGNOSIS — K219 Gastro-esophageal reflux disease without esophagitis: Secondary | ICD-10-CM | POA: Diagnosis not present

## 2019-09-01 DIAGNOSIS — M1612 Unilateral primary osteoarthritis, left hip: Secondary | ICD-10-CM | POA: Diagnosis not present

## 2019-09-01 DIAGNOSIS — Z885 Allergy status to narcotic agent status: Secondary | ICD-10-CM | POA: Diagnosis not present

## 2019-09-01 DIAGNOSIS — E119 Type 2 diabetes mellitus without complications: Secondary | ICD-10-CM | POA: Diagnosis not present

## 2019-09-01 DIAGNOSIS — Z800 Family history of malignant neoplasm of digestive organs: Secondary | ICD-10-CM

## 2019-09-01 DIAGNOSIS — Z7984 Long term (current) use of oral hypoglycemic drugs: Secondary | ICD-10-CM | POA: Diagnosis not present

## 2019-09-01 DIAGNOSIS — Z8349 Family history of other endocrine, nutritional and metabolic diseases: Secondary | ICD-10-CM

## 2019-09-01 DIAGNOSIS — Z471 Aftercare following joint replacement surgery: Secondary | ICD-10-CM | POA: Diagnosis not present

## 2019-09-01 DIAGNOSIS — N400 Enlarged prostate without lower urinary tract symptoms: Secondary | ICD-10-CM | POA: Diagnosis present

## 2019-09-01 DIAGNOSIS — G8929 Other chronic pain: Secondary | ICD-10-CM | POA: Diagnosis present

## 2019-09-01 DIAGNOSIS — Z6834 Body mass index (BMI) 34.0-34.9, adult: Secondary | ICD-10-CM | POA: Diagnosis not present

## 2019-09-01 DIAGNOSIS — M069 Rheumatoid arthritis, unspecified: Secondary | ICD-10-CM | POA: Diagnosis not present

## 2019-09-01 DIAGNOSIS — Z96642 Presence of left artificial hip joint: Secondary | ICD-10-CM | POA: Diagnosis not present

## 2019-09-01 HISTORY — PX: TOTAL HIP ARTHROPLASTY: SHX124

## 2019-09-01 LAB — GLUCOSE, CAPILLARY
Glucose-Capillary: 159 mg/dL — ABNORMAL HIGH (ref 70–99)
Glucose-Capillary: 171 mg/dL — ABNORMAL HIGH (ref 70–99)

## 2019-09-01 SURGERY — ARTHROPLASTY, HIP, TOTAL, ANTERIOR APPROACH
Anesthesia: Spinal | Site: Hip | Laterality: Left

## 2019-09-01 MED ORDER — MENTHOL 3 MG MT LOZG: 1.0000 | LOZENGE | OROMUCOSAL | Status: DC | PRN

## 2019-09-01 MED ORDER — METHOCARBAMOL 500 MG PO TABS
ORAL_TABLET | ORAL | Status: AC
  Filled 2019-09-01: qty 1

## 2019-09-01 MED ORDER — METHOCARBAMOL 500 MG PO TABS
750.0000 mg | ORAL_TABLET | Freq: Four times a day (QID) | ORAL | Status: DC | PRN
  Administered 2019-09-01: 750 mg via ORAL

## 2019-09-01 MED ORDER — ACETAMINOPHEN 325 MG PO TABS: 325.0000 mg | ORAL_TABLET | Freq: Four times a day (QID) | ORAL | Status: DC | PRN

## 2019-09-01 MED ORDER — PROPOFOL 500 MG/50ML IV EMUL
INTRAVENOUS | Status: AC
  Filled 2019-09-01: qty 50

## 2019-09-01 MED ORDER — CEFAZOLIN SODIUM-DEXTROSE 1-4 GM/50ML-% IV SOLN: 1.0000 g | Freq: Four times a day (QID) | INTRAVENOUS | Status: DC

## 2019-09-01 MED ORDER — MEPIVACAINE HCL (PF) 2 % IJ SOLN
INTRAMUSCULAR | Status: DC | PRN
  Administered 2019-09-01: 3.5 mL via EPIDURAL

## 2019-09-01 MED ORDER — PHENYLEPHRINE 40 MCG/ML (10ML) SYRINGE FOR IV PUSH (FOR BLOOD PRESSURE SUPPORT)
PREFILLED_SYRINGE | INTRAVENOUS | Status: AC
  Filled 2019-09-01: qty 10

## 2019-09-01 MED ORDER — DIPHENHYDRAMINE HCL 12.5 MG/5ML PO ELIX: 12.5000 mg | ORAL_SOLUTION | ORAL | Status: DC | PRN

## 2019-09-01 MED ORDER — GLIPIZIDE ER 10 MG PO TB24: 10.0000 mg | ORAL_TABLET | Freq: Every day | ORAL | Status: DC

## 2019-09-01 MED ORDER — CANAGLIFLOZIN 100 MG PO TABS: 100.0000 mg | ORAL_TABLET | Freq: Every day | ORAL | Status: DC

## 2019-09-01 MED ORDER — HYDROMORPHONE HCL 1 MG/ML IJ SOLN
0.5000 mg | INTRAMUSCULAR | Status: DC | PRN
  Administered 2019-09-01 (×4): 0.5 mg via INTRAVENOUS

## 2019-09-01 MED ORDER — TRANEXAMIC ACID-NACL 1000-0.7 MG/100ML-% IV SOLN
1000.0000 mg | INTRAVENOUS | Status: AC
  Administered 2019-09-01: 1000 mg via INTRAVENOUS
  Filled 2019-09-01: qty 100

## 2019-09-01 MED ORDER — ASPIRIN 81 MG PO CHEW: 81.0000 mg | CHEWABLE_TABLET | Freq: Two times a day (BID) | ORAL | Status: DC

## 2019-09-01 MED ORDER — LIRAGLUTIDE 18 MG/3ML ~~LOC~~ SOPN: 1.8000 mg | PEN_INJECTOR | Freq: Every evening | SUBCUTANEOUS | Status: DC

## 2019-09-01 MED ORDER — ONDANSETRON HCL 4 MG PO TABS: 4.0000 mg | ORAL_TABLET | Freq: Four times a day (QID) | ORAL | Status: DC | PRN

## 2019-09-01 MED ORDER — 0.9 % SODIUM CHLORIDE (POUR BTL) OPTIME
TOPICAL | Status: DC | PRN
  Administered 2019-09-01: 1000 mL

## 2019-09-01 MED ORDER — GABAPENTIN 400 MG PO CAPS
400.0000 mg | ORAL_CAPSULE | Freq: Four times a day (QID) | ORAL | Status: DC
  Administered 2019-09-01: 13:00:00 400 mg via ORAL
  Filled 2019-09-01: qty 1

## 2019-09-01 MED ORDER — LACTATED RINGERS IV BOLUS
500.0000 mL | Freq: Once | INTRAVENOUS | Status: AC
  Administered 2019-09-01: 10:00:00 500 mL via INTRAVENOUS

## 2019-09-01 MED ORDER — ONDANSETRON HCL 4 MG/2ML IJ SOLN
INTRAMUSCULAR | Status: AC
  Filled 2019-09-01: qty 2

## 2019-09-01 MED ORDER — ASPIRIN EC 81 MG PO TBEC: 81.0000 mg | DELAYED_RELEASE_TABLET | Freq: Two times a day (BID) | ORAL | 0 refills | Status: DC

## 2019-09-01 MED ORDER — METFORMIN HCL 500 MG PO TABS: 1000.0000 mg | ORAL_TABLET | Freq: Two times a day (BID) | ORAL | Status: DC

## 2019-09-01 MED ORDER — ONDANSETRON HCL 4 MG/2ML IJ SOLN
INTRAMUSCULAR | Status: DC | PRN
  Administered 2019-09-01: 4 mg via INTRAVENOUS

## 2019-09-01 MED ORDER — PROPOFOL 500 MG/50ML IV EMUL
INTRAVENOUS | Status: AC
  Filled 2019-09-01: qty 150

## 2019-09-01 MED ORDER — HYDROMORPHONE HCL 1 MG/ML IJ SOLN
INTRAMUSCULAR | Status: AC
  Filled 2019-09-01: qty 2

## 2019-09-01 MED ORDER — PHENOL 1.4 % MT LIQD: 1.0000 | OROMUCOSAL | Status: DC | PRN

## 2019-09-01 MED ORDER — PROPOFOL 500 MG/50ML IV EMUL
INTRAVENOUS | Status: DC | PRN
  Administered 2019-09-01: 100 ug/kg/min via INTRAVENOUS

## 2019-09-01 MED ORDER — DEXAMETHASONE SODIUM PHOSPHATE 10 MG/ML IJ SOLN: 10.0000 mg | Freq: Once | INTRAMUSCULAR | Status: DC

## 2019-09-01 MED ORDER — HYDROMORPHONE HCL 1 MG/ML IJ SOLN: 0.5000 mg | INTRAMUSCULAR | Status: DC | PRN

## 2019-09-01 MED ORDER — POLYETHYLENE GLYCOL 3350 17 G PO PACK: 17.0000 g | PACK | Freq: Every day | ORAL | Status: DC | PRN

## 2019-09-01 MED ORDER — CHLORHEXIDINE GLUCONATE 4 % EX LIQD: 60.0000 mL | Freq: Once | CUTANEOUS | Status: DC

## 2019-09-01 MED ORDER — HYDROCODONE-ACETAMINOPHEN 7.5-325 MG PO TABS
1.0000 | ORAL_TABLET | Freq: Four times a day (QID) | ORAL | Status: DC | PRN
  Administered 2019-09-01: 12:00:00 1 via ORAL

## 2019-09-01 MED ORDER — OXYCODONE HCL 5 MG PO TABS: 5.0000 mg | ORAL_TABLET | ORAL | Status: DC | PRN

## 2019-09-01 MED ORDER — BUPIVACAINE LIPOSOME 1.3 % IJ SUSP
INTRAMUSCULAR | Status: DC | PRN
  Administered 2019-09-01: 10 mL

## 2019-09-01 MED ORDER — LACTATED RINGERS IV BOLUS
250.0000 mL | Freq: Once | INTRAVENOUS | Status: AC
  Administered 2019-09-01: 12:00:00 250 mL via INTRAVENOUS

## 2019-09-01 MED ORDER — ONDANSETRON HCL 4 MG/2ML IJ SOLN: 4.0000 mg | Freq: Four times a day (QID) | INTRAMUSCULAR | Status: DC | PRN

## 2019-09-01 MED ORDER — LACTATED RINGERS IV SOLN: INTRAVENOUS | Status: DC

## 2019-09-01 MED ORDER — PROMETHAZINE HCL 25 MG/ML IJ SOLN: 6.2500 mg | INTRAMUSCULAR | Status: DC | PRN

## 2019-09-01 MED ORDER — DOCUSATE SODIUM 100 MG PO CAPS: 100.0000 mg | ORAL_CAPSULE | Freq: Two times a day (BID) | ORAL | Status: DC

## 2019-09-01 MED ORDER — TRANEXAMIC ACID-NACL 1000-0.7 MG/100ML-% IV SOLN
INTRAVENOUS | Status: AC
  Filled 2019-09-01: qty 100

## 2019-09-01 MED ORDER — MIDAZOLAM HCL 2 MG/2ML IJ SOLN
INTRAMUSCULAR | Status: AC
  Filled 2019-09-01: qty 2

## 2019-09-01 MED ORDER — SODIUM CHLORIDE FLUSH 0.9 % IV SOLN
INTRAVENOUS | Status: DC | PRN
  Administered 2019-09-01: 20 mL

## 2019-09-01 MED ORDER — MEPIVACAINE HCL (PF) 2 % IJ SOLN
INTRAMUSCULAR | Status: AC
  Filled 2019-09-01: qty 20

## 2019-09-01 MED ORDER — ONDANSETRON HCL 4 MG PO TABS: 4.0000 mg | ORAL_TABLET | Freq: Three times a day (TID) | ORAL | 0 refills | Status: DC | PRN

## 2019-09-01 MED ORDER — HYDROMORPHONE HCL 1 MG/ML IJ SOLN
0.2500 mg | INTRAMUSCULAR | Status: DC | PRN
  Administered 2019-09-01 (×4): 0.5 mg via INTRAVENOUS

## 2019-09-01 MED ORDER — METOCLOPRAMIDE HCL 5 MG PO TABS: 5.0000 mg | ORAL_TABLET | Freq: Three times a day (TID) | ORAL | Status: DC | PRN

## 2019-09-01 MED ORDER — MIDAZOLAM HCL 2 MG/2ML IJ SOLN: 0.5000 mg | Freq: Once | INTRAMUSCULAR | Status: DC | PRN

## 2019-09-01 MED ORDER — PANTOPRAZOLE SODIUM 40 MG PO TBEC: 40.0000 mg | DELAYED_RELEASE_TABLET | Freq: Every day | ORAL | Status: DC

## 2019-09-01 MED ORDER — OXYCODONE HCL 5 MG PO TABS: 5.0000 mg | ORAL_TABLET | ORAL | 0 refills | Status: AC | PRN

## 2019-09-01 MED ORDER — MEPERIDINE HCL 50 MG/ML IJ SOLN: 6.2500 mg | INTRAMUSCULAR | Status: DC | PRN

## 2019-09-01 MED ORDER — METOCLOPRAMIDE HCL 5 MG/ML IJ SOLN: 5.0000 mg | Freq: Three times a day (TID) | INTRAMUSCULAR | Status: DC | PRN

## 2019-09-01 MED ORDER — LIDOCAINE 2% (20 MG/ML) 5 ML SYRINGE
INTRAMUSCULAR | Status: AC
  Filled 2019-09-01: qty 5

## 2019-09-01 MED ORDER — PROPOFOL 10 MG/ML IV BOLUS
INTRAVENOUS | Status: AC
  Filled 2019-09-01: qty 20

## 2019-09-01 MED ORDER — LORATADINE 10 MG PO TABS: 10.0000 mg | ORAL_TABLET | Freq: Every day | ORAL | Status: DC

## 2019-09-01 MED ORDER — DICYCLOMINE HCL 10 MG PO CAPS: 10.0000 mg | ORAL_CAPSULE | Freq: Four times a day (QID) | ORAL | Status: DC | PRN

## 2019-09-01 MED ORDER — TRANEXAMIC ACID-NACL 1000-0.7 MG/100ML-% IV SOLN
1000.0000 mg | Freq: Once | INTRAVENOUS | Status: AC
  Administered 2019-09-01: 11:00:00 1000 mg via INTRAVENOUS

## 2019-09-01 MED ORDER — WATER FOR IRRIGATION, STERILE IR SOLN
Status: DC | PRN
  Administered 2019-09-01: 2000 mL

## 2019-09-01 MED ORDER — HYDROCODONE-ACETAMINOPHEN 7.5-325 MG PO TABS
ORAL_TABLET | ORAL | Status: AC
  Filled 2019-09-01: qty 1

## 2019-09-01 MED ORDER — SODIUM CHLORIDE (PF) 0.9 % IJ SOLN
INTRAMUSCULAR | Status: AC
  Filled 2019-09-01: qty 20

## 2019-09-01 MED ORDER — TAMSULOSIN HCL 0.4 MG PO CAPS: 0.4000 mg | ORAL_CAPSULE | Freq: Every day | ORAL | Status: DC

## 2019-09-01 MED ORDER — SORBITOL 70 % SOLN: 30.0000 mL | Freq: Every day | Status: DC | PRN

## 2019-09-01 MED ORDER — EPHEDRINE 5 MG/ML INJ
INTRAVENOUS | Status: AC
  Filled 2019-09-01: qty 10

## 2019-09-01 MED ORDER — MIDAZOLAM HCL 2 MG/2ML IJ SOLN
INTRAMUSCULAR | Status: DC | PRN
  Administered 2019-09-01: 2 mg via INTRAVENOUS

## 2019-09-01 MED ORDER — CELECOXIB 200 MG PO CAPS
400.0000 mg | ORAL_CAPSULE | Freq: Once | ORAL | Status: AC
  Administered 2019-09-01: 400 mg via ORAL
  Filled 2019-09-01: qty 2

## 2019-09-01 MED ORDER — DEXAMETHASONE SODIUM PHOSPHATE 10 MG/ML IJ SOLN
INTRAMUSCULAR | Status: AC
  Filled 2019-09-01: qty 1

## 2019-09-01 MED ORDER — ACETAMINOPHEN 500 MG PO TABS
1000.0000 mg | ORAL_TABLET | Freq: Once | ORAL | Status: AC
  Administered 2019-09-01: 1000 mg via ORAL
  Filled 2019-09-01: qty 2

## 2019-09-01 MED ORDER — CEFAZOLIN SODIUM-DEXTROSE 2-4 GM/100ML-% IV SOLN
2.0000 g | INTRAVENOUS | Status: AC
  Administered 2019-09-01: 2 g via INTRAVENOUS
  Filled 2019-09-01: qty 100

## 2019-09-01 MED ORDER — CELECOXIB 200 MG PO CAPS: 200.0000 mg | ORAL_CAPSULE | Freq: Two times a day (BID) | ORAL | Status: DC

## 2019-09-01 MED ORDER — POVIDONE-IODINE 10 % EX SWAB
2.0000 "application " | Freq: Once | CUTANEOUS | Status: AC
  Administered 2019-09-01: 2 via TOPICAL

## 2019-09-01 MED ORDER — MAGNESIUM CITRATE PO SOLN: 1.0000 | Freq: Once | ORAL | Status: DC | PRN

## 2019-09-01 SURGICAL SUPPLY — 43 items
BAG ZIPLOCK 12X15 (MISCELLANEOUS) IMPLANT
BLADE SAG 18X100X1.27 (BLADE) ×3 IMPLANT
BLADE SURG SZ10 CARB STEEL (BLADE) ×6 IMPLANT
CHLORAPREP W/TINT 26 (MISCELLANEOUS) ×3 IMPLANT
CLOSURE STERI-STRIP 1/2X4 (GAUZE/BANDAGES/DRESSINGS) ×1
CLSR STERI-STRIP ANTIMIC 1/2X4 (GAUZE/BANDAGES/DRESSINGS) ×2 IMPLANT
COVER PERINEAL POST (MISCELLANEOUS) ×3 IMPLANT
COVER SURGICAL LIGHT HANDLE (MISCELLANEOUS) ×3 IMPLANT
COVER WAND RF STERILE (DRAPES) IMPLANT
DECANTER SPIKE VIAL GLASS SM (MISCELLANEOUS) ×6 IMPLANT
DRAPE IMP U-DRAPE 54X76 (DRAPES) ×3 IMPLANT
DRAPE STERI IOBAN 125X83 (DRAPES) ×3 IMPLANT
DRAPE U-SHAPE 47X51 STRL (DRAPES) ×6 IMPLANT
DRSG MEPILEX BORDER 4X8 (GAUZE/BANDAGES/DRESSINGS) ×3 IMPLANT
ELECT BLADE TIP CTD 4 INCH (ELECTRODE) ×3 IMPLANT
ELECT REM PT RETURN 15FT ADLT (MISCELLANEOUS) ×3 IMPLANT
GLOVE BIO SURGEON STRL SZ7.5 (GLOVE) ×6 IMPLANT
GLOVE BIOGEL PI IND STRL 8 (GLOVE) ×2 IMPLANT
GLOVE BIOGEL PI INDICATOR 8 (GLOVE) ×4
GOWN STRL REUS W/TWL LRG LVL3 (GOWN DISPOSABLE) ×3 IMPLANT
GOWN STRL REUS W/TWL XL LVL3 (GOWN DISPOSABLE) ×3 IMPLANT
HEAD CERAMIC FEMORAL 36MM (Head) ×2 IMPLANT
HOLDER FOLEY CATH W/STRAP (MISCELLANEOUS) IMPLANT
INSERT TRIDENT POLY 36MM 0DEG (Insert) ×2 IMPLANT
KIT TURNOVER KIT A (KITS) IMPLANT
MANIFOLD NEPTUNE II (INSTRUMENTS) ×3 IMPLANT
NS IRRIG 1000ML POUR BTL (IV SOLUTION) ×3 IMPLANT
PACK ANTERIOR HIP CUSTOM (KITS) ×3 IMPLANT
PENCIL SMOKE EVACUATOR (MISCELLANEOUS) IMPLANT
PROTECTOR NERVE ULNAR (MISCELLANEOUS) ×3 IMPLANT
SCREW HEX LP 6.5X20 (Screw) ×2 IMPLANT
SHELL ACETABUL CLUSTER SZ 54 (Shell) ×2 IMPLANT
STEM HIP 127 DEG (Stem) ×2 IMPLANT
SUT MNCRL AB 4-0 PS2 18 (SUTURE) ×3 IMPLANT
SUT STRATAFIX 0 PDS 27 VIOLET (SUTURE) ×3
SUT VIC AB 0 CT1 36 (SUTURE) ×3 IMPLANT
SUT VIC AB 1 CT1 36 (SUTURE) ×3 IMPLANT
SUT VIC AB 2-0 CT1 27 (SUTURE) ×4
SUT VIC AB 2-0 CT1 TAPERPNT 27 (SUTURE) ×2 IMPLANT
SUTURE STRATFX 0 PDS 27 VIOLET (SUTURE) ×1 IMPLANT
TRAY FOLEY MTR SLVR 16FR STAT (SET/KITS/TRAYS/PACK) IMPLANT
WATER STERILE IRR 1000ML POUR (IV SOLUTION) ×6 IMPLANT
YANKAUER SUCT BULB TIP 10FT TU (MISCELLANEOUS) ×3 IMPLANT

## 2019-09-01 NOTE — Anesthesia Postprocedure Evaluation (Signed)
Anesthesia Post Note  Patient: Steve Mccormick  Procedure(s) Performed: TOTAL HIP ARTHROPLASTY ANTERIOR APPROACH (Left Hip)     Patient location during evaluation: PACU Anesthesia Type: Spinal Level of consciousness: awake and alert, patient cooperative and oriented Pain management: pain level controlled (pain rated 4 now, much improved) Respiratory status: nonlabored ventilation, spontaneous breathing and respiratory function stable Cardiovascular status: blood pressure returned to baseline and stable Postop Assessment: no apparent nausea or vomiting, spinal receding and patient able to bend at knees Anesthetic complications: no    Last Vitals:  Vitals:   09/01/19 0555 09/01/19 0938  BP: (!) 141/73 110/74  Pulse: 86 80  Resp: 18 14  Temp: 37.2 C 36.6 C  SpO2: 100% 100%    Last Pain:  Vitals:   09/01/19 0938  TempSrc:   PainSc: 9                  Jnya Brossard,E. Melita Villalona

## 2019-09-01 NOTE — Anesthesia Procedure Notes (Signed)
Spinal  Patient location during procedure: OR Start time: 09/01/2019 7:35 AM Staffing Performed: resident/CRNA  Anesthesiologist: Annye Asa, MD Resident/CRNA: Gerald Leitz, CRNA Preanesthetic Checklist Completed: patient identified, IV checked, site marked, risks and benefits discussed, surgical consent, monitors and equipment checked, pre-op evaluation and timeout performed Spinal Block Patient position: sitting Prep: Betadine Patient monitoring: heart rate, continuous pulse ox, blood pressure and cardiac monitor Approach: midline Location: L4-5 Injection technique: single-shot Needle Needle type: Introducer and Pencan  Needle gauge: 24 G Needle length: 9 cm Assessment Sensory level: T4 Additional Notes Negative paresthesia. Negative blood return. Positive free-flowing CSF. Expiration date of kit checked and confirmed. Patient tolerated procedure well, without complications. Patient disclosed lumbar bruise from previous fall prior to hospital admission. Spinal placed above visible bruising at L3-L4 interspace.

## 2019-09-01 NOTE — Discharge Instructions (Signed)

## 2019-09-01 NOTE — Evaluation (Signed)
Physical Therapy Evaluation Patient Details Name: Steve Mccormick MRN: [id number] DOB: 1960-10-17 Today's Date: 09/01/2019   History of Present Illness  Patient is 58 y.o. male s/p Lt THA anterior approach on 09/01/19 with PMH significant for HTN, GERD, DM, psoriatic arthritis..   Clinical Impression  Steve Mccormick is a 58 y.o. male POD 0 s/p Lt THA anterior approach. Patient reports independence with mobility at baseline with occasional use of SPC due to hip pain. Patient is now limited by functional impairments (see PT problem list below) and requires supervision/min guard for transfers and gait with RW. Patient was able to ambulate ~110 feet with RW and min guard and cues for safe walker management. Patient educated on safe sequencing for stair mobility and verbalized safe guarding position for people assisting with mobility. Patient instructed in exercises to facilitate ROM and circulation. Patient will benefit from continued skilled PT interventions to address impairments and progress towards PLOF. Patient has met mobility goals at adequate level for discharge home; will continue to follow if pt continues acute stay to progress towards Mod I goals.    Follow Up Recommendations Follow surgeon's recommendation for DC plan and follow-up therapies    Equipment Recommendations  None recommended by PT    Recommendations for Other Services       Precautions / Restrictions Precautions Precautions: Fall Restrictions Weight Bearing Restrictions: No      Mobility  Bed Mobility Overal bed mobility: Needs Assistance Bed Mobility: Supine to Sit;Sit to Supine     Supine to sit: HOB elevated;Supervision Sit to supine: HOB elevated;Supervision   General bed mobility comments: no cues or assist required  Transfers Overall transfer level: Needs assistance Equipment used: Rolling walker (2 wheeled) Transfers: Sit to/from Stand Sit to Stand: Min guard;Supervision          General transfer comment: cues for safe hand placement and technique with RW, pt able to perform power up without assist and no overt LOB noted  Ambulation/Gait Ambulation/Gait assistance: Min guard;Supervision Gait Distance (Feet): 110 Feet Assistive device: Rolling walker (2 wheeled) Gait Pattern/deviations: Decreased stride length;Step-through pattern;Decreased stance time - left Gait velocity: decreased   General Gait Details: cues at start for safe hand placement and to maintain safe proximity to RW, no overt LOB noted.  Stairs Stairs: Yes Stairs assistance: Min guard Stair Management: Two rails;Forwards;Backwards;With walker;Step to pattern Number of Stairs: 4(1x 3, 1x 1) General stair comments: verbal cues for safe sequencing of step to pattern and "up with good, down with bad". pt verbalized understanding of safe guarding position for person(s) assisting him to return home. pt also instructed on technique for how to ascend/descend singel step or threshold with revers step up using RW.  Wheelchair Mobility    Modified Rankin (Stroke Patients Only)       Balance Overall balance assessment: Needs assistance Sitting-balance support: No upper extremity supported Sitting balance-Leahy Scale: Good     Standing balance support: During functional activity;Bilateral upper extremity supported Standing balance-Leahy Scale: Fair              Pertinent Vitals/Pain Pain Assessment: 0-10 Pain Score: 5  Pain Location: Lt hip Pain Descriptors / Indicators: Aching Pain Intervention(s): Limited activity within patient's tolerance;Monitored during session;Repositioned    Home Living Family/patient expects to be discharged to:: Private residence Living Arrangements: Spouse/significant other Available Help at Discharge: Family;Available 24 hours/day Type of Home: House Home Access: Stairs to enter Entrance Stairs-Rails: Right;Left;Can reach both Entrance Stairs-Number of  Steps: 4  Home Layout: One level Home Equipment: Walker - 2 wheels;Shower seat;Bedside commode;Grab bars - tub/shower;Hand held shower head;Cane - single point Additional Comments: shower seat and BSC are his mothers and he can borrow if needed    Prior Function Level of Independence: Independent         Comments: occasional use of SPC to manage hip pain     Hand Dominance   Dominant Hand: Right    Extremity/Trunk Assessment   Upper Extremity Assessment Upper Extremity Assessment: Overall WFL for tasks assessed    Lower Extremity Assessment Lower Extremity Assessment: Overall WFL for tasks assessed    Cervical / Trunk Assessment Cervical / Trunk Assessment: Normal;Other exceptions Cervical / Trunk Exceptions: pt has significant bruise and edema with hematoma on lumbar spine, dark purple bruise, pt reports he had a fall on ice on Wedensday and radiograph found no acute injury.  Communication   Communication: No difficulties  Cognition Arousal/Alertness: Awake/alert Behavior During Therapy: WFL for tasks assessed/performed Overall Cognitive Status: Within Functional Limits for tasks assessed           General Comments      Exercises Total Joint Exercises Ankle Circles/Pumps: AROM;10 reps;Supine;Both Quad Sets: AROM;Supine;5 reps;Left Short Arc Quad: AROM;5 reps;Supine;Left Heel Slides: 5 reps;Supine;Left;AAROM Hip ABduction/ADduction: AROM;Supine;5 reps;Left Long Arc Quad: AROM;5 reps;Seated;Left Other Exercises Other Exercises: Educated on standing exercises via demonstration, handout, and instruction: marching, hip abduction, hip extension, and knee flexion.   Assessment/Plan    PT Assessment Patient needs continued PT services  PT Problem List Decreased strength;Decreased activity tolerance;Decreased range of motion;Decreased balance;Decreased mobility;Decreased knowledge of use of DME       PT Treatment Interventions DME instruction;Functional mobility  training;Balance training;Patient/family education;Therapeutic activities;Gait training;Stair training;Therapeutic exercise    PT Goals (Current goals can be found in the Care Plan section)  Acute Rehab PT Goals Patient Stated Goal: to get back to walking for exercise PT Goal Formulation: With patient Time For Goal Achievement: 09/08/19 Potential to Achieve Goals: Good    Frequency 7X/week    AM-PAC PT "6 Clicks" Mobility  Outcome Measure Help needed turning from your back to your side while in a flat bed without using bedrails?: None Help needed moving from lying on your back to sitting on the side of a flat bed without using bedrails?: None Help needed moving to and from a bed to a chair (including a wheelchair)?: A Little Help needed standing up from a chair using your arms (e.g., wheelchair or bedside chair)?: A Little Help needed to walk in hospital room?: A Little Help needed climbing 3-5 steps with a railing? : A Little 6 Click Score: 20    End of Session Equipment Utilized During Treatment: Gait belt Activity Tolerance: Patient tolerated treatment well Patient left: in bed;with call bell/phone within reach Nurse Communication: Mobility status PT Visit Diagnosis: Muscle weakness (generalized) (M62.81);Difficulty in walking, not elsewhere classified (R26.2)    Time: 5456-2563 PT Time Calculation (min) (ACUTE ONLY): 38 min   Charges:   PT Evaluation $PT Eval Low Complexity: 1 Low PT Treatments $Gait Training: 8-22 mins $Therapeutic Exercise: 8-22 mins        Gwynneth Albright, PT, DPT Physical Therapist with [facility]  09/01/2019 2:30 PM

## 2019-09-01 NOTE — Op Note (Signed)
09/01/2019  9:04 AM  PATIENT:  Steve Mccormick   MRN: [id number]  PRE-OPERATIVE DIAGNOSIS:  OSTEOARTHRITIS LEFT HIP  POST-OPERATIVE DIAGNOSIS:  OSTEOARTHRITIS LEFT HIP  PROCEDURE:  Procedure(s): TOTAL HIP ARTHROPLASTY ANTERIOR APPROACH  PREOPERATIVE INDICATIONS:    EDILSON VITAL is an 58 y.o. male who has a diagnosis of Primary osteoarthritis of left hip and elected for surgical management after failing conservative treatment.  The risks benefits and alternatives were discussed with the patient including but not limited to the risks of nonoperative treatment, versus surgical intervention including infection, bleeding, nerve injury, periprosthetic fracture, the need for revision surgery, dislocation, leg length discrepancy, blood clots, cardiopulmonary complications, morbidity, mortality, among others, and they were willing to proceed.     OPERATIVE REPORT     SURGEON:   Renette Butters, MD    ASSISTANT:  Roxan Hockey, PA-C, he was present and scrubbed throughout the case, critical for completion in a timely fashion, and for retraction, instrumentation, and closure.     ANESTHESIA:  General    COMPLICATIONS:  None.     COMPONENTS:  [employer] acolade fit femur size 5 with a 36 mm -0 head ball and an acetabular shell size 54 with a  polyethylene liner    PROCEDURE IN DETAIL:   The patient was met in the holding area and  identified.  The appropriate hip was identified and marked at the operative site.  The patient was then transported to the OR  and  placed under anesthesia per that record.  At that point, the patient was  placed in the supine position and  secured to the operating room table and all bony prominences padded. He received pre-operative antibiotics    The operative lower extremity was prepped from the iliac crest to the distal leg.  Sterile draping was performed.  Time out was performed prior to incision.      Skin incision was made just 2 cm lateral to the  ASIS  extending in line with the tensor fascia lata. Electrocautery was used to control all bleeders. I dissected down sharply to the fascia of the tensor fascia lata was confirmed that the muscle fibers beneath were running posteriorly. I then incised the fascia over the superficial tensor fascia lata in line with the incision. The fascia was elevated off the anterior aspect of the muscle the muscle was retracted posteriorly and protected throughout the case. I then used electrocautery to incise the tensor fascia lata fascia control and all bleeders. Immediately visible was the fat over top of the anterior neck and capsule.  I removed the anterior fat from the capsule and elevated the rectus muscle off of the anterior capsule. I then removed a large time of capsule. The retractors were then placed over the anterior acetabulum as well as around the superior and inferior neck.  I then made a femoral neck cut. Then used the power corkscrew to remove the femoral head from the acetabulum and thoroughly irrigated the acetabulum. I sized the femoral head.    I then exposed the deep acetabulum, cleared out any tissue including the ligamentum teres.   After adequate visualization, I excised the labrum, and then sequentially reamed.  I then impacted the acetabular implant into place using fluoroscopy for guidance.  Appropriate version and inclination was confirmed clinically matching their bony anatomy, and with fluoroscopy.  I placed a 20 mm screw in the posterior/superio position with an excellent bite.    I then placed the polyethylene liner  in place  I then adducted the leg and released the external rotators from the posterior femur allowing it to be easily delivered up lateral and anterior to the acetabulum for preparation of the femoral canal.    I then prepared the proximal femur using the cookie-cutter and then sequentially reamed and broached.  A trial broach, neck, and head was utilized, and I  reduced the hip and used floroscopy to assess the neck length and femoral implant.  I then impacted the femoral prosthesis into place into the appropriate version. The hip was then reduced and fluoroscopy confirmed appropriate position. Leg lengths were restored.  I then irrigated the hip copiously again with, and repaired the fascia with Vicryl, followed by monocryl for the subcutaneous tissue, Monocryl for the skin, Steri-Strips and sterile gauze. The patient was then awakened and returned to PACU in stable and satisfactory condition. There were no complications.  POST OPERATIVE PLAN: WBAT, DVT px: SCD's/TED, ambulation and chemical dvt px  Edmonia Lynch, MD Orthopedic Surgeon [phone]

## 2019-09-01 NOTE — Transfer of Care (Signed)
Immediate Anesthesia Transfer of Care Note  Patient: Steve Mccormick  Procedure(s) Performed: Procedure(s): TOTAL HIP ARTHROPLASTY ANTERIOR APPROACH (Left)  Patient Location: PACU  Anesthesia Type:General  Level of Consciousness: Alert, Awake, Oriented  Airway & Oxygen Therapy: Patient Spontanous Breathing  Post-op Assessment: Report given to RN  Post vital signs: Reviewed and stable  Last Vitals:  Vitals:   09/01/19 0555  BP: (!) 141/73  Pulse: 86  Resp: 18  Temp: 37.2 C  SpO2: 919%    Complications: No apparent anesthesia complications

## 2019-09-01 NOTE — Progress Notes (Signed)
Apolonio Schneiders, PT called to notify that patient is in phase 2 with Estell Harpin, RN and available for therapy.

## 2019-09-01 NOTE — Interval H&P Note (Signed)
I participated in the care of this patient and agree with the above history, physical and evaluation. I performed a review of the history and a physical exam as detailed   Norm Wray Daniel Daisha Filosa MD  

## 2019-09-02 ENCOUNTER — Encounter: Payer: Self-pay | Admitting: *Deleted

## 2019-09-02 NOTE — Discharge Summary (Addendum)
Discharge Summary  Patient ID: Steve Mccormick MRN: [id number] DOB/AGE: 1960-11-18 58 y.o.  Admit date: 09/01/2019 Discharge date: 09/02/2019  Admission Diagnoses:  Primary osteoarthritis of left hip  Discharge Diagnoses:  Principal Problem:   Primary osteoarthritis of left hip Active Problems:   Rheumatoid arthritis (HCC)   GERD (gastroesophageal reflux disease)   Diabetes mellitus   Hypertension   Past Medical History:  Diagnosis Date  . Diabetes mellitus   . Fatty liver   . GERD (gastroesophageal reflux disease)   . Hypertension   . Psoriatic arthritis (HCC)    psoriatic and RA    Surgeries: Procedure(s): TOTAL HIP ARTHROPLASTY ANTERIOR APPROACH on 09/01/2019   Consultants (if any):   Discharged Condition: Improved  Hospital Course: Steve Mccormick is an 58 y.o. male who was admitted 09/01/2019 with a diagnosis of Primary osteoarthritis of left hip and went to the operating room on 09/01/2019 and underwent the above named procedures.    He was given perioperative antibiotics:  Anti-infectives (From admission, onward)   Start     Dose/Rate Route Frequency Ordered Stop   09/01/19 1400  ceFAZolin (ANCEF) IVPB 1 g/50 mL premix  Status:  Discontinued     1 g 100 mL/hr over 30 Minutes Intravenous Every 6 hours 09/01/19 1012 09/01/19 1635   09/01/19 0600  ceFAZolin (ANCEF) IVPB 2g/100 mL premix     2 g 200 mL/hr over 30 Minutes Intravenous On call to O.R. 09/01/19 4193 09/01/19 0724    .  He was given sequential compression devices, early ambulation, and Aspirin for DVT prophylaxis.  Postoperative plan was for early mobilization with patient's goal of discharging day of surgery.  At time of discharge on POD0, his pain was controlled p.o., he was voiding, and he mobilized well with physical therapy.  He benefited maximally from the hospital stay and there were no complications.    Recent vital signs:  Vitals:   09/01/19 1132 09/01/19 1200  BP: 129/76  135/75  Pulse: 78 80  Resp: 15   Temp: 98 F (36.7 C) 98 F (36.7 C)  SpO2: 92% 97%    Recent laboratory studies:  Lab Results  Component Value Date   HGB 14.6 08/24/2019   HGB 13.9 06/07/2011   HGB 14.0 12/28/2010   Lab Results  Component Value Date   WBC 8.0 08/24/2019   PLT 251 08/24/2019   No results found for: INR Lab Results  Component Value Date   NA 138 08/24/2019   K 4.7 08/24/2019   CL 104 08/24/2019   CO2 22 08/24/2019   BUN 19 08/24/2019   CREATININE 0.76 08/24/2019   GLUCOSE 169 (H) 08/24/2019    Discharge Medications:   Allergies as of 09/01/2019      Reactions   Fish Allergy Anaphylaxis, Swelling, Other (See Comments)   Shaky    Percocet [oxycodone-acetaminophen] Itching      Medication List    TAKE these medications   aspirin EC 81 MG tablet Take 1 tablet (81 mg total) by mouth 2 (two) times daily. For DVT prophylaxis for 30 days after surgery.   celecoxib 200 MG capsule Commonly known as: CELEBREX Take 200 mg by mouth 2 (two) times daily.   cetirizine 10 MG tablet Commonly known as: ZYRTEC Take 10 mg by mouth daily. Allergies   diclofenac Sodium 1 % Gel Commonly known as: VOLTAREN Apply 1 application topically 4 (four) times daily as needed (hand pain).   empagliflozin 25 MG Tabs tablet Commonly  known as: JARDIANCE Take 25 mg by mouth daily.   gabapentin 400 MG capsule Commonly known as: NEURONTIN Take 400 mg by mouth 4 (four) times daily.   glipiZIDE 10 MG 24 hr tablet Commonly known as: GLUCOTROL XL Take 10 mg by mouth daily.   HYDROcodone-acetaminophen 7.5-325 MG tablet Commonly known as: NORCO Take 1 tablet by mouth 4 (four) times daily as needed (pain.).   Invokana 300 MG Tabs tablet Generic drug: canagliflozin Take 300 mg by mouth daily before breakfast.   lisinopril 10 MG tablet Commonly known as: ZESTRIL Take 10 mg by mouth daily.   metFORMIN 1000 MG tablet Commonly known as: GLUCOPHAGE Take 1,000 mg by  mouth 2 (two) times daily.   methocarbamol 750 MG tablet Commonly known as: ROBAXIN Take 750 mg by mouth 4 (four) times daily as needed for muscle spasms.   multivitamin with minerals Tabs tablet Take 1 tablet by mouth every evening.   ondansetron 4 MG tablet Commonly known as: Zofran Take 1 tablet (4 mg total) by mouth every 8 (eight) hours as needed for nausea or vomiting.   oxyCODONE 5 MG immediate release tablet Commonly known as: Roxicodone Take 1 tablet (5 mg total) by mouth every 4 (four) hours as needed for up to 7 days for breakthrough pain.   pantoprazole 40 MG tablet Commonly known as: PROTONIX TAKE 1 TABLET BY MOUTH DAILY BEFORE BREAKFAST   REMICADE IV Inject 1 Dose into the vein every 8 (eight) weeks.   tamsulosin 0.4 MG Caps capsule Commonly known as: FLOMAX Take 0.4 mg by mouth daily after supper.   Victoza 18 MG/3ML Sopn Generic drug: liraglutide Inject 1.8 mg into the skin every evening.       Diagnostic Studies: DG C-Arm 1-60 Min-No Report  Result Date: 09/01/2019 Fluoroscopy was utilized by the requesting physician.  No radiographic interpretation.   DG HIP OPERATIVE UNILAT WITH PELVIS LEFT  Result Date: 09/01/2019 CLINICAL DATA:  Left anterior hip replacement EXAM: OPERATIVE LEFT HIP (WITH PELVIS IF PERFORMED) 2 VIEWS TECHNIQUE: Fluoroscopic spot image(s) were submitted for interpretation post-operatively. COMPARISON:  06/12/2019 FINDINGS: Changes of left hip replacement. Normal AP alignment. No hardware bony complicating feature. IMPRESSION: Left hip replacement.  No visible complicating feature. Electronically Signed   By: Rolm Baptise M.D.   On: 09/01/2019 09:13    Disposition: Discharge disposition: 01-Home or Self Care         Follow-up Information    Renette Butters, MD. Go on 09/16/2019.   Specialty: Orthopedic Surgery Why: Your appointment has been scheduled for 4:15.  Contact information: [address] [place] [address] [phone]        Practice, [facility]. Go on 09/03/2019.   Specialty: Physical Therapy Why: You are scheduled to start Outpatient physical therapy at 11:00. Please arrive by 10:30 to complete your paperwork  THIS IS [facility] IN [place].  Contact information: [place] [id number] [phone]        Renette Butters, MD.   Specialty: Orthopedic Surgery Contact information: [address] [place] [address] [phone]            Signed: Prudencio Burly III PA-C 09/02/2019, 7:34 AM

## 2019-09-03 DIAGNOSIS — M25552 Pain in left hip: Secondary | ICD-10-CM | POA: Diagnosis not present

## 2019-09-08 DIAGNOSIS — M25552 Pain in left hip: Secondary | ICD-10-CM | POA: Diagnosis not present

## 2019-09-10 DIAGNOSIS — M25552 Pain in left hip: Secondary | ICD-10-CM | POA: Diagnosis not present

## 2019-09-16 DIAGNOSIS — M1612 Unilateral primary osteoarthritis, left hip: Secondary | ICD-10-CM | POA: Diagnosis not present

## 2019-09-17 DIAGNOSIS — M25552 Pain in left hip: Secondary | ICD-10-CM | POA: Diagnosis not present

## 2019-09-28 DIAGNOSIS — M47816 Spondylosis without myelopathy or radiculopathy, lumbar region: Secondary | ICD-10-CM | POA: Diagnosis not present

## 2019-09-28 DIAGNOSIS — M47812 Spondylosis without myelopathy or radiculopathy, cervical region: Secondary | ICD-10-CM | POA: Diagnosis not present

## 2019-09-28 DIAGNOSIS — L4050 Arthropathic psoriasis, unspecified: Secondary | ICD-10-CM | POA: Diagnosis not present

## 2019-09-28 DIAGNOSIS — G894 Chronic pain syndrome: Secondary | ICD-10-CM | POA: Diagnosis not present

## 2019-10-06 DIAGNOSIS — L4050 Arthropathic psoriasis, unspecified: Secondary | ICD-10-CM | POA: Diagnosis not present

## 2019-10-06 DIAGNOSIS — M456 Ankylosing spondylitis lumbar region: Secondary | ICD-10-CM | POA: Diagnosis not present

## 2019-10-14 DIAGNOSIS — Z96642 Presence of left artificial hip joint: Secondary | ICD-10-CM | POA: Diagnosis not present

## 2019-11-11 ENCOUNTER — Ambulatory Visit: Payer: Medicare Other | Admitting: Gastroenterology

## 2019-11-19 DIAGNOSIS — E1129 Type 2 diabetes mellitus with other diabetic kidney complication: Secondary | ICD-10-CM | POA: Diagnosis not present

## 2019-11-23 DIAGNOSIS — M47816 Spondylosis without myelopathy or radiculopathy, lumbar region: Secondary | ICD-10-CM | POA: Diagnosis not present

## 2019-11-23 DIAGNOSIS — M47812 Spondylosis without myelopathy or radiculopathy, cervical region: Secondary | ICD-10-CM | POA: Diagnosis not present

## 2019-11-23 DIAGNOSIS — L4050 Arthropathic psoriasis, unspecified: Secondary | ICD-10-CM | POA: Diagnosis not present

## 2019-11-23 DIAGNOSIS — G894 Chronic pain syndrome: Secondary | ICD-10-CM | POA: Diagnosis not present

## 2019-11-24 DIAGNOSIS — L4050 Arthropathic psoriasis, unspecified: Secondary | ICD-10-CM | POA: Diagnosis not present

## 2019-11-24 DIAGNOSIS — L409 Psoriasis, unspecified: Secondary | ICD-10-CM | POA: Diagnosis not present

## 2019-11-24 DIAGNOSIS — M150 Primary generalized (osteo)arthritis: Secondary | ICD-10-CM | POA: Diagnosis not present

## 2019-11-24 DIAGNOSIS — M456 Ankylosing spondylitis lumbar region: Secondary | ICD-10-CM | POA: Diagnosis not present

## 2019-11-25 DIAGNOSIS — E1129 Type 2 diabetes mellitus with other diabetic kidney complication: Secondary | ICD-10-CM | POA: Diagnosis not present

## 2019-11-25 DIAGNOSIS — L4052 Psoriatic arthritis mutilans: Secondary | ICD-10-CM | POA: Diagnosis not present

## 2019-11-27 DIAGNOSIS — M456 Ankylosing spondylitis lumbar region: Secondary | ICD-10-CM | POA: Diagnosis not present

## 2019-11-27 DIAGNOSIS — L4050 Arthropathic psoriasis, unspecified: Secondary | ICD-10-CM | POA: Diagnosis not present

## 2019-12-08 ENCOUNTER — Encounter: Payer: Self-pay | Admitting: Gastroenterology

## 2019-12-08 NOTE — Progress Notes (Signed)
Primary Care Physician: Asencion Noble, MD  Primary Gastroenterologist:  Garfield Cornea, MD   Chief Complaint  Patient presents with  . Gastroesophageal Reflux    f/u. Doing fine  . Abdominal Pain    occas pain  . diarrhea/constipation    BM's daily, doing fine    HPI: Steve Mccormick is a 59 y.o. male here for follow-up.  Seen back in September for bowel concerns.  Also with chronic GERD.  When I saw him he was complaining of abdominal cramping associated with alternating constipation and diarrhea.  Tendency towards diarrhea.  No bright red blood per rectum.  This complained about breakthrough heartburn symptoms as well.  EGD with colonoscopy December 2020 showed diverticulosis, long redundant colon.  1 cm salmon-colored epithelium of the distal esophagus, benign biopsy.  Benign gastric polyp as well.  Recommended next colonoscopy in 5 years for history of colon polyps.  Mother also had colon polyps and grandmother with colon cancer.  Right upper quadrant ultrasound in November to further evaluate mildly elevated LFTs.  Probable fatty liver with suspected hepatomegaly noted.  Showed no evidence of iron overload or viral hepatitis.  Instructions for fatty liver provided.  He is due for repeat LFTs in the next 1 to 2 months.  Patient is doing very well.  Pantoprazole is controlling his reflux much better.  Denies any dysphagia.  No abdominal pain.  He is taking Benefiber daily which is regulated his bowel movements.  No melena or rectal bleeding.  Occasionally takes Bentyl for abdominal cramping with good relief.  He reports an intentional weight loss of about 6 pounds based on his scales at home.  Current Outpatient Medications  Medication Sig Dispense Refill  . canagliflozin (INVOKANA) 300 MG TABS tablet Take 300 mg by mouth daily before breakfast.    . celecoxib (CELEBREX) 200 MG capsule Take 200 mg by mouth 2 (two) times daily.    . cetirizine (ZYRTEC) 10 MG tablet Take 10 mg by  mouth daily. Allergies     . diclofenac Sodium (VOLTAREN) 1 % GEL Apply 1 application topically 4 (four) times daily as needed (hand pain).    Marland Kitchen dicyclomine (BENTYL) 10 MG capsule TAKE 1 CAPSULE BY MOUTH UP TO 4 TIMES A DAY FOR CRAMPS/DIARRHEA (HOLD FOR CONSTIPATION) 360 capsule 1  . empagliflozin (JARDIANCE) 25 MG TABS tablet Take 25 mg by mouth daily.    Marland Kitchen gabapentin (NEURONTIN) 400 MG capsule Take 400 mg by mouth 4 (four) times daily.     Marland Kitchen glipiZIDE (GLUCOTROL XL) 10 MG 24 hr tablet Take 10 mg by mouth daily.    Marland Kitchen HYDROcodone-acetaminophen (NORCO) 7.5-325 MG tablet Take 1 tablet by mouth 4 (four) times daily as needed (pain.).     Marland Kitchen inFLIXimab (REMICADE IV) Inject 1 Dose into the vein every 6 (six) weeks.     Marland Kitchen lisinopril (ZESTRIL) 10 MG tablet Take 10 mg by mouth daily.    . metFORMIN (GLUCOPHAGE) 1000 MG tablet Take 1,000 mg by mouth 2 (two) times daily.    . methocarbamol (ROBAXIN) 750 MG tablet Take 750 mg by mouth 4 (four) times daily as needed for muscle spasms.     . Multiple Vitamin (MULTIVITAMIN WITH MINERALS) TABS tablet Take 1 tablet by mouth every evening.    . ondansetron (ZOFRAN) 4 MG tablet Take 1 tablet (4 mg total) by mouth every 8 (eight) hours as needed for nausea or vomiting. 20 tablet 0  . pantoprazole (PROTONIX) 40  MG tablet TAKE 1 TABLET BY MOUTH DAILY BEFORE BREAKFAST 90 tablet 1  . tamsulosin (FLOMAX) 0.4 MG CAPS capsule Take 0.4 mg by mouth daily after supper.    Marland Kitchen VICTOZA 18 MG/3ML SOPN Inject 1.8 mg into the skin every evening.     No current facility-administered medications for this visit.    Allergies as of 12/09/2019 - Review Complete 12/09/2019  Allergen Reaction Noted  . Fish allergy Anaphylaxis, Swelling, and Other (See Comments) 10/08/2012  . Percocet [oxycodone-acetaminophen] Itching 02/12/2014    ROS:  General: Negative for anorexia, weight loss, fever, chills, fatigue, weakness. ENT: Negative for hoarseness, difficulty swallowing , nasal  congestion. CV: Negative for chest pain, angina, palpitations, dyspnea on exertion, peripheral edema.  Respiratory: Negative for dyspnea at rest, dyspnea on exertion, cough, sputum, wheezing.  GI: See history of present illness. GU:  Negative for dysuria, hematuria, urinary incontinence, urinary frequency, nocturnal urination.  Endo: Negative for unusual weight change.    Physical Examination:   BP 135/74   Pulse 75   Temp (!) 97.5 F (36.4 C) (Oral)   Ht 5\' 11"  (1.803 m)   Wt 251 lb 9.6 oz (114.1 kg)   BMI 35.09 kg/m   General: Well-nourished, well-developed in no acute distress.  Eyes: No icterus. Mouth: Oropharyngeal mucosa moist and pink , no lesions erythema or exudate. Lungs: Clear to auscultation bilaterally.  Heart: Regular rate and rhythm, no murmurs rubs or gallops.  Abdomen: Bowel sounds are normal, nontender, nondistended, no hepatosplenomegaly or masses, no abdominal bruits or hernia , no rebound or guarding.   Extremities: No lower extremity edema. No clubbing or deformities. Neuro: Alert and oriented x 4   Skin: Warm and dry, no jaundice.   Psych: Alert and cooperative, normal mood and affect.  Labs:  Lab Results  Component Value Date   ALT 60 (H) 08/11/2019   AST 45 (H) 08/11/2019   ALKPHOS 61 08/11/2019   BILITOT 0.7 08/11/2019    Imaging Studies: No results found.

## 2019-12-09 ENCOUNTER — Other Ambulatory Visit: Payer: Self-pay

## 2019-12-09 ENCOUNTER — Ambulatory Visit: Payer: Medicare Other | Admitting: Gastroenterology

## 2019-12-09 ENCOUNTER — Encounter: Payer: Self-pay | Admitting: Gastroenterology

## 2019-12-09 VITALS — BP 135/74 | HR 75 | Temp 97.5°F | Ht 71.0 in | Wt 251.6 lb

## 2019-12-09 DIAGNOSIS — R198 Other specified symptoms and signs involving the digestive system and abdomen: Secondary | ICD-10-CM

## 2019-12-09 DIAGNOSIS — K760 Fatty (change of) liver, not elsewhere classified: Secondary | ICD-10-CM | POA: Diagnosis not present

## 2019-12-09 DIAGNOSIS — K219 Gastro-esophageal reflux disease without esophagitis: Secondary | ICD-10-CM | POA: Diagnosis not present

## 2019-12-09 NOTE — Patient Instructions (Signed)
1. Continue pantoprazole 40 mg daily before breakfast for reflux. 2. Continue fiber supplement daily. 3. Continue dicyclomine up to 4 times daily as needed for diarrhea or abdominal cramping. 4. We will want to see you at least once yearly for fatty liver.  Please have any blood work by other providers regarding your liver forwarded to [place] for our review.  Instructions for fatty liver: Recommend 1-2# weight loss per week until ideal body weight through exercise & diet. Low fat/cholesterol diet.   Avoid sweets, sodas, fruit juices, sweetened beverages like tea, etc. Gradually increase exercise from 15 min daily up to 1 hr per day 5 days/week. Limit alcohol use.   Fatty Liver Disease  Fatty liver disease occurs when too much fat has built up in your liver cells. Fatty liver disease is also called hepatic steatosis or steatohepatitis. The liver removes harmful substances from your bloodstream and produces fluids that your body needs. It also helps your body use and store energy from the food you eat. In many cases, fatty liver disease does not cause symptoms or problems. It is often diagnosed when tests are being done for other reasons. However, over time, fatty liver can cause inflammation that may lead to more serious liver problems, such as scarring of the liver (cirrhosis) and liver failure. Fatty liver is associated with insulin resistance, increased body fat, high blood pressure (hypertension), and high cholesterol. These are features of metabolic syndrome and increase your risk for stroke, diabetes, and heart disease. What are the causes? This condition may be caused by:  Drinking too much alcohol.  Poor nutrition.  Obesity.  Cushing's syndrome.  Diabetes.  High cholesterol.  Certain drugs.  Poisons.  Some viral infections.  Pregnancy. What increases the risk? You are more likely to develop this condition if you:  Abuse alcohol.  Are overweight.  Have  diabetes.  Have hepatitis.  Have a high triglyceride level.  Are pregnant. What are the signs or symptoms? Fatty liver disease often does not cause symptoms. If symptoms do develop, they can include:  Fatigue.  Weakness.  Weight loss.  Confusion.  Abdominal pain.  Nausea and vomiting.  Yellowing of your skin and the white parts of your eyes (jaundice).  Itchy skin. How is this diagnosed? This condition may be diagnosed by:  A physical exam and medical history.  Blood tests.  Imaging tests, such as an ultrasound, CT scan, or MRI.  A liver biopsy. A small sample of liver tissue is removed using a needle. The sample is then looked at under a microscope. How is this treated? Fatty liver disease is often caused by other health conditions. Treatment for fatty liver may involve medicines and lifestyle changes to manage conditions such as:  Alcoholism.  High cholesterol.  Diabetes.  Being overweight or obese. Follow these instructions at home:   Do not drink alcohol. If you have trouble quitting, ask your health care provider how to safely quit with the help of medicine or a supervised program. This is important to keep your condition from getting worse.  Eat a healthy diet as told by your health care provider. Ask your health care provider about working with a diet and nutrition specialist (dietitian) to develop an eating plan.  Exercise regularly. This can help you lose weight and control your cholesterol and diabetes. Talk to your health care provider about an exercise plan and which activities are best for you.  Take over-the-counter and prescription medicines only as told by your health  care provider.  Keep all follow-up visits as told by your health care provider. This is important. Contact a health care provider if: You have trouble controlling your:  Blood sugar. This is especially important if you have diabetes.  Cholesterol.  Drinking of alcohol. Get  help right away if:  You have abdominal pain.  You have jaundice.  You have nausea and vomiting.  You vomit blood or material that looks like coffee grounds.  You have stools that are black, tar-like, or bloody. Summary  Fatty liver disease develops when too much fat builds up in the cells of your liver.  Fatty liver disease often causes no symptoms or problems. However, over time, fatty liver can cause inflammation that may lead to more serious liver problems, such as scarring of the liver (cirrhosis).  You are more likely to develop this condition if you abuse alcohol, are pregnant, are overweight, have diabetes, have hepatitis, or have high triglyceride levels.  Contact your health care provider if you have trouble controlling your weight, blood sugar, cholesterol, or drinking of alcohol. This information is not intended to replace advice given to you by your health care provider. Make sure you discuss any questions you have with your health care provider. Document Revised: 08/09/2017 Document Reviewed: 06/05/2017 Elsevier Patient Education  2020 Else[employer].   Nonalcoholic Fatty Liver Disease Diet, Adult Nonalcoholic fatty liver disease is a condition that causes fat to build up in and around the liver. The disease makes it harder for the liver to work the way that it should. Following a healthy diet can help to keep nonalcoholic fatty liver disease under control. It can also help to prevent or improve conditions that are associated with the disease, such as heart disease, diabetes, high blood pressure, and abnormal cholesterol levels. Along with regular exercise, this diet:  Promotes weight loss.  Helps to control blood sugar levels.  Helps to improve the way that the body uses insulin. What are tips for following this plan? Reading food labels Always check food labels for:  The amount of saturated fat in a food. You should limit your intake of saturated fat. Saturated  fat is found in foods that come from animals, including meat and dairy products such as butter, cheese, and whole milk.  The amount of fiber in a food. You should choose high-fiber foods such as fruits, vegetables, and whole grains. Try to get 25-30 grams (g) of fiber a day.  Cooking  When cooking, use heart-healthy oils that are high in monounsaturated fats. These include olive oil, canola oil, and avocado oil.  Limit frying or deep-frying foods. Cook foods using healthy methods such as baking, boiling, steaming, and grilling instead. Meal planning  You may want to keep track of how many calories you take in. Eating the right amount of calories will help you achieve a healthy weight. Meeting with a registered dietitian can help you get started.  Limit how often you eat takeout and fast food. These foods are usually very high in fat, salt, and sugar.  Use the glycemic index (GI) to plan your meals. The index tells you how quickly a food will raise your blood sugar. Choose low-GI foods (GI less than 55). These foods take a longer time to raise blood sugar. A registered dietitian can help you identify foods lower on the GI scale. Lifestyle  You may want to follow a Mediterranean diet. This diet includes a lot of vegetables, lean meats or fish, whole grains, fruits,  and healthy oils and fats. What foods can I eat?  Fruits Bananas. Apples. Oranges. Grapes. Papaya. Mango. Pomegranate. Kiwi. Grapefruit. Cherries. Vegetables Lettuce. Spinach. Peas. Beets. Cauliflower. Cabbage. Broccoli. Carrots. Tomatoes. Squash. Eggplant. Herbs. Peppers. Onions. Cucumbers. Brussels sprouts. Yams and sweet potatoes. Beans. Lentils. Grains Whole wheat or whole-grain foods, including breads, crackers, cereals, and pasta. Stone-ground whole wheat. Unsweetened oatmeal. Bulgur. Barley. Quinoa. Brown or wild rice. Corn or whole wheat flour tortillas. Meats and other proteins Lean meats. Poultry. Tofu. Seafood and  shellfish. Dairy Low-fat or fat-free dairy products, such as yogurt, cottage cheese, or cheese. Beverages Water. Sugar-free drinks. Tea. Coffee. Low-fat or skim milk. Milk alternatives, such as soy or almond milk. Real fruit juice. Fats and oils Avocado. Canola or olive oil. Nuts and nut butters. Seeds. Seasonings and condiments Mustard. Relish. Low-fat, low-sugar ketchup and barbecue sauce. Low-fat or fat-free mayonnaise. Sweets and desserts Sugar-free sweets. The items listed above may not be a complete list of foods and beverages you can eat. Contact a dietitian for more information. What foods should I limit or avoid? Meats and other proteins Limit red meat to 1-2 times a week. Dairy [employer]. Fats and oils Palm oil and coconut oil. Fried foods. Other foods Processed foods. Foods that contain a lot of salt or sodium. Sweets and desserts Sweets that contain sugar. Beverages Sweetened drinks, such as sweet tea, milkshakes, iced sweet drinks, and sodas. Alcohol. The items listed above may not be a complete list of foods and beverages you should avoid. Contact a dietitian for more information. Where to find more information The [employer] of Diabetes and Digestive and Kidney Diseases: [employer] Summary  Nonalcoholic fatty liver disease is a condition that causes fat to build up in and around the liver.  Following a healthy diet can help to keep nonalcoholic fatty liver disease under control. Your diet should be rich in fruits, vegetables, whole grains, and lean proteins.  Limit your intake of saturated fat. Saturated fat is found in foods that come from animals, including meat and dairy products such as butter, cheese, and whole milk.  This diet promotes weight loss, helps to control blood sugar levels, and helps to improve the way that the body uses insulin. This information is not intended to replace advice given to you by your health care provider. Make sure  you discuss any questions you have with your health care provider. Document Revised: 12/19/2018 Document Reviewed: 09/18/2018 Elsevier Patient Education  [place].

## 2019-12-09 NOTE — Assessment & Plan Note (Addendum)
Mildly elevated transaminases.  Has labs done every 3 to 4 months by PCP for his diabetes.  Plans to have LFTs checked with him next.  We will await records.  Encouraged physical activity, modest weight loss of 10 pounds over the next 2 to 3 months, is working towards 10% weight loss for initial goal.  Fatty liver information and diet information provided to patient.  We will plan to see him yearly with blood work in interim.

## 2019-12-09 NOTE — Assessment & Plan Note (Signed)
Patient doing very well on pantoprazole 40 mg daily.  Continue current regimen.

## 2019-12-09 NOTE — Assessment & Plan Note (Signed)
Doing much better with daily Benefiber.  Next colonoscopy planned for 5 years for personal history of polyps, family history of polyps and colon cancer.  May continue Bentyl as needed abdominal cramping.

## 2019-12-09 NOTE — Progress Notes (Signed)
Cc'ed to pcp °

## 2019-12-14 ENCOUNTER — Other Ambulatory Visit: Payer: Self-pay | Admitting: *Deleted

## 2019-12-14 MED ORDER — PANTOPRAZOLE SODIUM 40 MG PO TBEC: DELAYED_RELEASE_TABLET | ORAL | 3 refills | Status: DC

## 2019-12-14 NOTE — Addendum Note (Signed)
Addended by: Gelene Mink on: 12/14/2019 03:57 PM   Modules accepted: Orders

## 2019-12-14 NOTE — Telephone Encounter (Signed)
Received refill request from [facility] for pantoprazole 40mg  QD before breakfast #90.

## 2019-12-14 NOTE — Telephone Encounter (Signed)
Completed.

## 2019-12-31 DIAGNOSIS — M47812 Spondylosis without myelopathy or radiculopathy, cervical region: Secondary | ICD-10-CM | POA: Diagnosis not present

## 2019-12-31 DIAGNOSIS — M47816 Spondylosis without myelopathy or radiculopathy, lumbar region: Secondary | ICD-10-CM | POA: Diagnosis not present

## 2019-12-31 DIAGNOSIS — G894 Chronic pain syndrome: Secondary | ICD-10-CM | POA: Diagnosis not present

## 2019-12-31 DIAGNOSIS — L4050 Arthropathic psoriasis, unspecified: Secondary | ICD-10-CM | POA: Diagnosis not present

## 2020-01-08 DIAGNOSIS — M456 Ankylosing spondylitis lumbar region: Secondary | ICD-10-CM | POA: Diagnosis not present

## 2020-01-08 DIAGNOSIS — L4050 Arthropathic psoriasis, unspecified: Secondary | ICD-10-CM | POA: Diagnosis not present

## 2020-02-11 ENCOUNTER — Other Ambulatory Visit: Payer: Self-pay | Admitting: Gastroenterology

## 2020-02-15 DIAGNOSIS — L4050 Arthropathic psoriasis, unspecified: Secondary | ICD-10-CM | POA: Diagnosis not present

## 2020-02-15 DIAGNOSIS — M47812 Spondylosis without myelopathy or radiculopathy, cervical region: Secondary | ICD-10-CM | POA: Diagnosis not present

## 2020-02-15 DIAGNOSIS — G894 Chronic pain syndrome: Secondary | ICD-10-CM | POA: Diagnosis not present

## 2020-02-15 DIAGNOSIS — M47816 Spondylosis without myelopathy or radiculopathy, lumbar region: Secondary | ICD-10-CM | POA: Diagnosis not present

## 2020-02-19 DIAGNOSIS — M456 Ankylosing spondylitis lumbar region: Secondary | ICD-10-CM | POA: Diagnosis not present

## 2020-03-25 DIAGNOSIS — M456 Ankylosing spondylitis lumbar region: Secondary | ICD-10-CM | POA: Diagnosis not present

## 2020-03-25 DIAGNOSIS — M150 Primary generalized (osteo)arthritis: Secondary | ICD-10-CM | POA: Diagnosis not present

## 2020-03-25 DIAGNOSIS — L4050 Arthropathic psoriasis, unspecified: Secondary | ICD-10-CM | POA: Diagnosis not present

## 2020-03-25 DIAGNOSIS — L409 Psoriasis, unspecified: Secondary | ICD-10-CM | POA: Diagnosis not present

## 2020-03-28 DIAGNOSIS — M47816 Spondylosis without myelopathy or radiculopathy, lumbar region: Secondary | ICD-10-CM | POA: Diagnosis not present

## 2020-03-28 DIAGNOSIS — G894 Chronic pain syndrome: Secondary | ICD-10-CM | POA: Diagnosis not present

## 2020-03-28 DIAGNOSIS — L4050 Arthropathic psoriasis, unspecified: Secondary | ICD-10-CM | POA: Diagnosis not present

## 2020-03-28 DIAGNOSIS — M47812 Spondylosis without myelopathy or radiculopathy, cervical region: Secondary | ICD-10-CM | POA: Diagnosis not present

## 2020-04-01 DIAGNOSIS — M456 Ankylosing spondylitis lumbar region: Secondary | ICD-10-CM | POA: Diagnosis not present

## 2020-04-01 DIAGNOSIS — L4050 Arthropathic psoriasis, unspecified: Secondary | ICD-10-CM | POA: Diagnosis not present

## 2020-05-13 DIAGNOSIS — M456 Ankylosing spondylitis lumbar region: Secondary | ICD-10-CM | POA: Diagnosis not present

## 2020-05-18 DIAGNOSIS — M47816 Spondylosis without myelopathy or radiculopathy, lumbar region: Secondary | ICD-10-CM | POA: Diagnosis not present

## 2020-05-18 DIAGNOSIS — M47812 Spondylosis without myelopathy or radiculopathy, cervical region: Secondary | ICD-10-CM | POA: Diagnosis not present

## 2020-05-18 DIAGNOSIS — G894 Chronic pain syndrome: Secondary | ICD-10-CM | POA: Diagnosis not present

## 2020-05-18 DIAGNOSIS — L4050 Arthropathic psoriasis, unspecified: Secondary | ICD-10-CM | POA: Diagnosis not present

## 2020-06-24 DIAGNOSIS — M456 Ankylosing spondylitis lumbar region: Secondary | ICD-10-CM | POA: Diagnosis not present

## 2020-06-24 DIAGNOSIS — Z79899 Other long term (current) drug therapy: Secondary | ICD-10-CM | POA: Diagnosis not present

## 2020-06-24 DIAGNOSIS — L4050 Arthropathic psoriasis, unspecified: Secondary | ICD-10-CM | POA: Diagnosis not present

## 2020-06-29 DIAGNOSIS — M47812 Spondylosis without myelopathy or radiculopathy, cervical region: Secondary | ICD-10-CM | POA: Diagnosis not present

## 2020-06-29 DIAGNOSIS — L4050 Arthropathic psoriasis, unspecified: Secondary | ICD-10-CM | POA: Diagnosis not present

## 2020-06-29 DIAGNOSIS — G894 Chronic pain syndrome: Secondary | ICD-10-CM | POA: Diagnosis not present

## 2020-06-29 DIAGNOSIS — M47816 Spondylosis without myelopathy or radiculopathy, lumbar region: Secondary | ICD-10-CM | POA: Diagnosis not present

## 2020-07-11 DIAGNOSIS — M542 Cervicalgia: Secondary | ICD-10-CM | POA: Diagnosis not present

## 2020-07-11 DIAGNOSIS — M25552 Pain in left hip: Secondary | ICD-10-CM | POA: Diagnosis not present

## 2020-07-11 DIAGNOSIS — M25511 Pain in right shoulder: Secondary | ICD-10-CM | POA: Diagnosis not present

## 2020-07-14 DIAGNOSIS — M5412 Radiculopathy, cervical region: Secondary | ICD-10-CM | POA: Diagnosis not present

## 2020-07-14 DIAGNOSIS — M150 Primary generalized (osteo)arthritis: Secondary | ICD-10-CM | POA: Diagnosis not present

## 2020-07-14 DIAGNOSIS — M456 Ankylosing spondylitis lumbar region: Secondary | ICD-10-CM | POA: Diagnosis not present

## 2020-07-14 DIAGNOSIS — L409 Psoriasis, unspecified: Secondary | ICD-10-CM | POA: Diagnosis not present

## 2020-07-14 DIAGNOSIS — L4050 Arthropathic psoriasis, unspecified: Secondary | ICD-10-CM | POA: Diagnosis not present

## 2020-07-26 DIAGNOSIS — Z79899 Other long term (current) drug therapy: Secondary | ICD-10-CM | POA: Diagnosis not present

## 2020-07-26 DIAGNOSIS — L4052 Psoriatic arthritis mutilans: Secondary | ICD-10-CM | POA: Diagnosis not present

## 2020-07-26 DIAGNOSIS — E1129 Type 2 diabetes mellitus with other diabetic kidney complication: Secondary | ICD-10-CM | POA: Diagnosis not present

## 2020-08-08 DIAGNOSIS — M25512 Pain in left shoulder: Secondary | ICD-10-CM | POA: Diagnosis not present

## 2020-08-09 DIAGNOSIS — L4050 Arthropathic psoriasis, unspecified: Secondary | ICD-10-CM | POA: Diagnosis not present

## 2020-08-09 DIAGNOSIS — I10 Essential (primary) hypertension: Secondary | ICD-10-CM | POA: Diagnosis not present

## 2020-08-09 DIAGNOSIS — E1122 Type 2 diabetes mellitus with diabetic chronic kidney disease: Secondary | ICD-10-CM | POA: Diagnosis not present

## 2020-08-09 DIAGNOSIS — Z0001 Encounter for general adult medical examination with abnormal findings: Secondary | ICD-10-CM | POA: Diagnosis not present

## 2020-08-09 DIAGNOSIS — R7309 Other abnormal glucose: Secondary | ICD-10-CM | POA: Diagnosis not present

## 2020-08-09 DIAGNOSIS — G720 Drug-induced myopathy: Secondary | ICD-10-CM | POA: Diagnosis not present

## 2020-08-09 DIAGNOSIS — M456 Ankylosing spondylitis lumbar region: Secondary | ICD-10-CM | POA: Diagnosis not present

## 2020-08-09 DIAGNOSIS — Z23 Encounter for immunization: Secondary | ICD-10-CM | POA: Diagnosis not present

## 2020-08-10 DIAGNOSIS — M47816 Spondylosis without myelopathy or radiculopathy, lumbar region: Secondary | ICD-10-CM | POA: Diagnosis not present

## 2020-08-10 DIAGNOSIS — L4050 Arthropathic psoriasis, unspecified: Secondary | ICD-10-CM | POA: Diagnosis not present

## 2020-08-10 DIAGNOSIS — M47812 Spondylosis without myelopathy or radiculopathy, cervical region: Secondary | ICD-10-CM | POA: Diagnosis not present

## 2020-08-10 DIAGNOSIS — Z79891 Long term (current) use of opiate analgesic: Secondary | ICD-10-CM | POA: Diagnosis not present

## 2020-08-10 DIAGNOSIS — G894 Chronic pain syndrome: Secondary | ICD-10-CM | POA: Diagnosis not present

## 2020-09-20 DIAGNOSIS — G894 Chronic pain syndrome: Secondary | ICD-10-CM | POA: Diagnosis not present

## 2020-09-20 DIAGNOSIS — L4050 Arthropathic psoriasis, unspecified: Secondary | ICD-10-CM | POA: Diagnosis not present

## 2020-09-20 DIAGNOSIS — M47816 Spondylosis without myelopathy or radiculopathy, lumbar region: Secondary | ICD-10-CM | POA: Diagnosis not present

## 2020-09-20 DIAGNOSIS — M47812 Spondylosis without myelopathy or radiculopathy, cervical region: Secondary | ICD-10-CM | POA: Diagnosis not present

## 2020-09-28 DIAGNOSIS — M456 Ankylosing spondylitis lumbar region: Secondary | ICD-10-CM | POA: Diagnosis not present

## 2020-09-28 DIAGNOSIS — Z79899 Other long term (current) drug therapy: Secondary | ICD-10-CM | POA: Diagnosis not present

## 2020-09-28 DIAGNOSIS — L4050 Arthropathic psoriasis, unspecified: Secondary | ICD-10-CM | POA: Diagnosis not present

## 2020-10-05 ENCOUNTER — Other Ambulatory Visit: Payer: Self-pay | Admitting: Orthopedic Surgery

## 2020-10-05 DIAGNOSIS — M542 Cervicalgia: Secondary | ICD-10-CM

## 2020-10-05 DIAGNOSIS — M25512 Pain in left shoulder: Secondary | ICD-10-CM | POA: Diagnosis not present

## 2020-10-09 ENCOUNTER — Ambulatory Visit
Admission: RE | Admit: 2020-10-09 | Discharge: 2020-10-09 | Disposition: A | Discharge status: Home / Self Care | Payer: Medicare Other | Source: Ambulatory Visit | Attending: Orthopedic Surgery | Admitting: Orthopedic Surgery

## 2020-10-09 DIAGNOSIS — M5021 Other cervical disc displacement,  high cervical region: Secondary | ICD-10-CM | POA: Diagnosis not present

## 2020-10-09 DIAGNOSIS — M47812 Spondylosis without myelopathy or radiculopathy, cervical region: Secondary | ICD-10-CM | POA: Diagnosis not present

## 2020-10-09 DIAGNOSIS — M542 Cervicalgia: Secondary | ICD-10-CM

## 2020-10-09 DIAGNOSIS — M25512 Pain in left shoulder: Secondary | ICD-10-CM | POA: Diagnosis not present

## 2020-10-09 DIAGNOSIS — M50221 Other cervical disc displacement at C4-C5 level: Secondary | ICD-10-CM | POA: Diagnosis not present

## 2020-10-31 DIAGNOSIS — E1129 Type 2 diabetes mellitus with other diabetic kidney complication: Secondary | ICD-10-CM | POA: Diagnosis not present

## 2020-11-01 DIAGNOSIS — L4050 Arthropathic psoriasis, unspecified: Secondary | ICD-10-CM | POA: Diagnosis not present

## 2020-11-01 DIAGNOSIS — M47812 Spondylosis without myelopathy or radiculopathy, cervical region: Secondary | ICD-10-CM | POA: Diagnosis not present

## 2020-11-01 DIAGNOSIS — M47816 Spondylosis without myelopathy or radiculopathy, lumbar region: Secondary | ICD-10-CM | POA: Diagnosis not present

## 2020-11-01 DIAGNOSIS — G894 Chronic pain syndrome: Secondary | ICD-10-CM | POA: Diagnosis not present

## 2020-11-02 ENCOUNTER — Other Ambulatory Visit: Payer: Self-pay | Admitting: Gastroenterology

## 2020-11-03 ENCOUNTER — Encounter: Payer: Self-pay | Admitting: Internal Medicine

## 2020-11-03 DIAGNOSIS — M5412 Radiculopathy, cervical region: Secondary | ICD-10-CM | POA: Diagnosis not present

## 2020-11-03 DIAGNOSIS — M47812 Spondylosis without myelopathy or radiculopathy, cervical region: Secondary | ICD-10-CM | POA: Diagnosis not present

## 2020-11-04 DIAGNOSIS — R7309 Other abnormal glucose: Secondary | ICD-10-CM | POA: Diagnosis not present

## 2020-11-04 DIAGNOSIS — G720 Drug-induced myopathy: Secondary | ICD-10-CM | POA: Diagnosis not present

## 2020-11-04 DIAGNOSIS — E1122 Type 2 diabetes mellitus with diabetic chronic kidney disease: Secondary | ICD-10-CM | POA: Diagnosis not present

## 2020-11-04 DIAGNOSIS — I10 Essential (primary) hypertension: Secondary | ICD-10-CM | POA: Diagnosis not present

## 2020-11-11 DIAGNOSIS — L409 Psoriasis, unspecified: Secondary | ICD-10-CM | POA: Diagnosis not present

## 2020-11-11 DIAGNOSIS — M2550 Pain in unspecified joint: Secondary | ICD-10-CM | POA: Diagnosis not present

## 2020-11-11 DIAGNOSIS — M5412 Radiculopathy, cervical region: Secondary | ICD-10-CM | POA: Diagnosis not present

## 2020-11-11 DIAGNOSIS — L4050 Arthropathic psoriasis, unspecified: Secondary | ICD-10-CM | POA: Diagnosis not present

## 2020-11-11 DIAGNOSIS — M456 Ankylosing spondylitis lumbar region: Secondary | ICD-10-CM | POA: Diagnosis not present

## 2020-11-11 DIAGNOSIS — Z79899 Other long term (current) drug therapy: Secondary | ICD-10-CM | POA: Diagnosis not present

## 2020-11-11 DIAGNOSIS — M150 Primary generalized (osteo)arthritis: Secondary | ICD-10-CM | POA: Diagnosis not present

## 2020-11-14 DIAGNOSIS — L4050 Arthropathic psoriasis, unspecified: Secondary | ICD-10-CM | POA: Diagnosis not present

## 2020-11-14 DIAGNOSIS — M456 Ankylosing spondylitis lumbar region: Secondary | ICD-10-CM | POA: Diagnosis not present

## 2020-11-30 DIAGNOSIS — M47816 Spondylosis without myelopathy or radiculopathy, lumbar region: Secondary | ICD-10-CM | POA: Diagnosis not present

## 2020-11-30 DIAGNOSIS — M47812 Spondylosis without myelopathy or radiculopathy, cervical region: Secondary | ICD-10-CM | POA: Diagnosis not present

## 2020-11-30 DIAGNOSIS — L4050 Arthropathic psoriasis, unspecified: Secondary | ICD-10-CM | POA: Diagnosis not present

## 2020-11-30 DIAGNOSIS — G894 Chronic pain syndrome: Secondary | ICD-10-CM | POA: Diagnosis not present

## 2020-12-12 DIAGNOSIS — M5412 Radiculopathy, cervical region: Secondary | ICD-10-CM | POA: Diagnosis not present

## 2020-12-15 ENCOUNTER — Other Ambulatory Visit: Payer: Self-pay | Admitting: Orthopedic Surgery

## 2020-12-20 ENCOUNTER — Encounter: Payer: Self-pay | Admitting: Internal Medicine

## 2020-12-20 ENCOUNTER — Ambulatory Visit: Payer: Medicare Other | Admitting: Internal Medicine

## 2020-12-29 DIAGNOSIS — L4050 Arthropathic psoriasis, unspecified: Secondary | ICD-10-CM | POA: Diagnosis not present

## 2020-12-29 DIAGNOSIS — M47816 Spondylosis without myelopathy or radiculopathy, lumbar region: Secondary | ICD-10-CM | POA: Diagnosis not present

## 2020-12-29 DIAGNOSIS — M47812 Spondylosis without myelopathy or radiculopathy, cervical region: Secondary | ICD-10-CM | POA: Diagnosis not present

## 2020-12-29 DIAGNOSIS — G894 Chronic pain syndrome: Secondary | ICD-10-CM | POA: Diagnosis not present

## 2021-01-02 DIAGNOSIS — M5412 Radiculopathy, cervical region: Secondary | ICD-10-CM | POA: Diagnosis not present

## 2021-01-02 NOTE — Pre-Procedure Instructions (Signed)
Surgical Instructions    Your procedure is scheduled on Thursday April 28th.   Report to [facility]The Main Entrance "A" at 0945 A.M., then check in with the Admitting office.  Call this number if you have problems the morning of surgery:  [phone]   If you have any questions prior to your surgery date call [phone]: Open Monday-Friday 8am-4pm    Remember:  Do not eat after midnight the night before your surgery  You may drink clear liquids until 0945 AM the morning of your surgery.   Clear liquids allowed are: Water, Non-Citrus Juices (without pulp), Carbonated Beverages, Clear Tea, Black Coffee Only, and Gatorade- Please choose diet or sugar free options    Patient Instructions  . The night before surgery:  o No food after midnight. ONLY clear liquids after midnight  . The day of surgery (if you have diabetes): o Drink ONE (1) 10 oz water bottle given to you in your pre admission testing appointment by 0945 AM the morning of surgery. Drink in one sitting. Do not sip.  o This drink was given to you during your hospital  pre-op appointment visit.  o Nothing else to drink after completing the  10 oz bottle of water.          If you have questions, please contact your surgeon's office.    Take these medicines the morning of surgery with A SIP OF WATER   gabapentin (NEURONTIN)  pantoprazole (PROTONIX)   leflunomide (ARAVA)  cetirizine (ZYRTEC)    Take these medicines if needed  ondansetron (ZOFRAN)  methocarbamol (ROBAXIN)  HYDROcodone-acetaminophen (NORCO)  dicyclomine (BENTYL)  As of today, STOP taking any Aspirin (unless otherwise instructed by your surgeon) Aleve, Naproxen, Ibuprofen, Motrin, Advil, Goody's, BC's, all herbal medications, fish oil, and all vitamins. This includes Celebrex and diclofenac Sodium (VOLTAREN) gel.   WHAT DO I DO ABOUT MY DIABETES MEDICATION?  Hold the following diabetes medications the day before surgery and the morning of  surgery  canagliflozin [facility])- Do not take the day before and do not take the morning of surgery  empagliflozin (JARDIANCE)- Do not take the day before and do not take the morning of surgery  glipiZIDE (GLUCOTROL XL)- Do not take the morning of surgery   pioglitazone (ACTOS)- Take the usual dose the day before surgery and do not take the morning of             surgery         HOW TO MANAGE YOUR DIABETES BEFORE AND AFTER SURGERY  Why is it important to control my blood sugar before and after surgery? . Improving blood sugar levels before and after surgery helps healing and can limit problems. . A way of improving blood sugar control is eating a healthy diet by: o  Eating less sugar and carbohydrates o  Increasing activity/exercise o  Talking with your doctor about reaching your blood sugar goals . High blood sugars (greater than 180 mg/dL) can raise your risk of infections and slow your recovery, so you will need to focus on controlling your diabetes during the weeks before surgery. . Make sure that the doctor who takes care of your diabetes knows about your planned surgery including the date and location.  How do I manage my blood sugar before surgery? . Check your blood sugar at least 4 times a day, starting 2 days before surgery, to make sure that the level is not too high or low. . Check your blood sugar  the morning of your surgery when you wake up and every 2 hours until you get to the Short Stay unit. o If your blood sugar is less than 70 mg/dL, you will need to treat for low blood sugar: - Do not take insulin. - Treat a low blood sugar (less than 70 mg/dL) with  cup of clear juice (cranberry or apple), 4 glucose tablets, OR glucose gel. - Recheck blood sugar in 15 minutes after treatment (to make sure it is greater than 70 mg/dL). If your blood sugar is not greater than 70 mg/dL on recheck, call [phone] for further instructions. . Report your blood sugar to the short  stay nurse when you get to Short Stay.  . If you are admitted to the hospital after surgery: o Your blood sugar will be checked by the staff and you will probably be given insulin after surgery (instead of oral diabetes medicines) to make sure you have good blood sugar levels. o The goal for blood sugar control after surgery is 80-180 mg/dL.                     Do not wear jewelry                       Do not wear lotions, powders, colognes, or deodorant.            Do not shave 48 hours prior to surgery.  Men may shave face and neck.            Do not bring valuables to the hospital.            [facility] is not responsible for any belongings or valuables.  Do NOT Smoke (Tobacco/Vaping) or drink Alcohol 24 hours prior to your procedure If you use a CPAP at night, you may bring all equipment for your overnight stay.   Contacts, glasses, dentures or partials may not be worn into surgery, please bring cases for these belongings   For patients admitted to the hospital, discharge time will be determined by your treatment team.   Patients discharged the day of surgery will not be allowed to drive home, and someone needs to stay with them for 24 hours.    Special instructions:   [facility]- Preparing For Surgery  Before surgery, you can play an important role. Because skin is not sterile, your skin needs to be as free of germs as possible. You can reduce the number of germs on your skin by washing with CHG (chlorahexidine gluconate) Soap before surgery.  CHG is an antiseptic cleaner which kills germs and bonds with the skin to continue killing germs even after washing.    Oral Hygiene is also important to reduce your risk of infection.  Remember - BRUSH YOUR TEETH THE MORNING OF SURGERY WITH YOUR REGULAR TOOTHPASTE  Please do not use if you have an allergy to CHG or antibacterial soaps. If your skin becomes reddened/irritated stop using the CHG.  Do not shave (including legs and underarms)  for at least 48 hours prior to first CHG shower. It is OK to shave your face.  Please follow these instructions carefully.   1. Shower the NIGHT BEFORE SURGERY and the MORNING OF SURGERY  2. If you chose to wash your hair, wash your hair first as usual with your normal shampoo.  3. After you shampoo, rinse your hair and body thoroughly to remove the shampoo.  4. Use CHG  Soap as you would any other liquid soap. You can apply CHG directly to the skin and wash gently with a scrungie or a clean washcloth.   5. Apply the CHG Soap to your body ONLY FROM THE NECK DOWN.  Do not use on open wounds or open sores. Avoid contact with your eyes, ears, mouth and genitals (private parts). Wash Face and genitals (private parts)  with your normal soap.   6. Wash thoroughly, paying special attention to the area where your surgery will be performed.  7. Thoroughly rinse your body with warm water from the neck down.  8. DO NOT shower/wash with your normal soap after using and rinsing off the CHG Soap.  9. Pat yourself dry with a CLEAN TOWEL.  10. Wear CLEAN PAJAMAS to bed the night before surgery  11. Place CLEAN SHEETS on your bed the night before your surgery  12. DO NOT SLEEP WITH PETS.   Day of Surgery: Take a shower with CHG soap. Wear Clean/Comfortable clothing the morning of surgery Do not apply any deodorants/lotions.   Remember to brush your teeth WITH YOUR REGULAR TOOTHPASTE.   Please read over the following fact sheets that you were given.

## 2021-01-03 ENCOUNTER — Other Ambulatory Visit: Payer: Self-pay

## 2021-01-03 ENCOUNTER — Encounter (HOSPITAL_COMMUNITY)
Admission: RE | Admit: 2021-01-03 | Discharge: 2021-01-03 | Disposition: A | Discharge status: Home / Self Care | Payer: Medicare Other | Source: Ambulatory Visit | Attending: Orthopedic Surgery | Admitting: Orthopedic Surgery

## 2021-01-03 ENCOUNTER — Encounter (HOSPITAL_COMMUNITY): Payer: Self-pay

## 2021-01-03 DIAGNOSIS — I10 Essential (primary) hypertension: Secondary | ICD-10-CM | POA: Diagnosis not present

## 2021-01-03 DIAGNOSIS — Z20822 Contact with and (suspected) exposure to covid-19: Secondary | ICD-10-CM | POA: Insufficient documentation

## 2021-01-03 DIAGNOSIS — Z01818 Encounter for other preprocedural examination: Secondary | ICD-10-CM | POA: Insufficient documentation

## 2021-01-03 HISTORY — DX: Presence of external hearing-aid: Z97.4

## 2021-01-03 HISTORY — DX: Tinnitus, unspecified ear: H93.19

## 2021-01-03 LAB — URINALYSIS, ROUTINE W REFLEX MICROSCOPIC
Bacteria, UA: NONE SEEN
Bilirubin Urine: NEGATIVE
Glucose, UA: >=500 mg/dL — AB
Hgb urine dipstick: NEGATIVE
Ketones, ur: NEGATIVE mg/dL
Leukocytes,Ua: NEGATIVE
Nitrite: NEGATIVE
Protein, ur: NEGATIVE mg/dL
Specific Gravity, Urine: 1.033 — ABNORMAL HIGH (ref 1.005–1.030)
pH: 6 (ref 5.0–8.0)

## 2021-01-03 LAB — CBC WITH DIFFERENTIAL/PLATELET
Abs Immature Granulocytes: 0.04 10*3/uL (ref 0.00–0.07)
Basophils Absolute: 0 10*3/uL (ref 0.0–0.1)
Basophils Relative: 1 %
Eosinophils Absolute: 0.1 10*3/uL (ref 0.0–0.5)
Eosinophils Relative: 1 %
HCT: 39.7 % (ref 39.0–52.0)
Hemoglobin: 12.4 g/dL — ABNORMAL LOW (ref 13.0–17.0)
Immature Granulocytes: 1 %
Lymphocytes Relative: 38 %
Lymphs Abs: 2.4 10*3/uL (ref 0.7–4.0)
MCH: 26.3 pg (ref 26.0–34.0)
MCHC: 31.2 g/dL (ref 30.0–36.0)
MCV: 84.3 fL (ref 80.0–100.0)
Monocytes Absolute: 0.5 10*3/uL (ref 0.1–1.0)
Monocytes Relative: 8 %
Neutro Abs: 3.3 10*3/uL (ref 1.7–7.7)
Neutrophils Relative %: 51 %
Platelets: 223 10*3/uL (ref 150–400)
RBC: 4.71 MIL/uL (ref 4.22–5.81)
RDW: 14.7 % (ref 11.5–15.5)
WBC: 6.4 10*3/uL (ref 4.0–10.5)
nRBC: 0 % (ref 0.0–0.2)

## 2021-01-03 LAB — COMPREHENSIVE METABOLIC PANEL
ALT: 40 U/L (ref 0–44)
AST: 38 U/L (ref 15–41)
Albumin: 3.8 g/dL (ref 3.5–5.0)
Alkaline Phosphatase: 51 U/L (ref 38–126)
Anion gap: 10 (ref 5–15)
BUN: 11 mg/dL (ref 6–20)
CO2: 25 mmol/L (ref 22–32)
Calcium: 9 mg/dL (ref 8.9–10.3)
Chloride: 105 mmol/L (ref 98–111)
Creatinine, Ser: 0.65 mg/dL (ref 0.61–1.24)
GFR, Estimated: >60 mL/min (ref >60)
Glucose, Bld: 165 mg/dL — ABNORMAL HIGH (ref 70–99)
Potassium: 3.8 mmol/L (ref 3.5–5.1)
Sodium: 140 mmol/L (ref 135–145)
Total Bilirubin: 0.7 mg/dL (ref 0.3–1.2)
Total Protein: 6.4 g/dL — ABNORMAL LOW (ref 6.5–8.1)

## 2021-01-03 LAB — PROTIME-INR
INR: 1 (ref 0.8–1.2)
Prothrombin Time: 13.1 seconds (ref 11.4–15.2)

## 2021-01-03 LAB — SARS CORONAVIRUS 2 (TAT 6-24 HRS): SARS Coronavirus 2: NEGATIVE

## 2021-01-03 LAB — TYPE AND SCREEN
ABO/RH(D): O NEG
Antibody Screen: NEGATIVE

## 2021-01-03 LAB — SURGICAL PCR SCREEN
MRSA, PCR: NEGATIVE
Staphylococcus aureus: NEGATIVE

## 2021-01-03 LAB — GLUCOSE, CAPILLARY: Glucose-Capillary: 158 mg/dL — ABNORMAL HIGH (ref 70–99)

## 2021-01-03 LAB — APTT: aPTT: 27 seconds (ref 24–36)

## 2021-01-03 NOTE — Pre-Procedure Instructions (Signed)
Surgical Instructions    Your procedure is scheduled on Thursday April 28th.   Report to [facility] Main Entrance "A" at 09:45 A.M., then check in with the Admitting office.  Call this number if you have problems the morning of surgery:  [phone]   If you have any questions prior to your surgery date call [phone]: Open Monday-Friday 8am-4pm    Remember:  Do not eat after midnight the night before your surgery  You may drink clear liquids until 09:45 AM the morning of your surgery.   Clear liquids allowed are: Water, Non-Citrus Juices (without pulp), Carbonated Beverages, Clear Tea, Black Coffee Only, and Gatorade- Please choose diet or sugar free options.    Patient Instructions  . The night before surgery:  o No food after midnight. ONLY clear liquids after midnight  . The day of surgery (if you have diabetes): o Drink ONE (1) 10 oz water bottle given to you in your pre admission testing appointment by 09:45 AM the morning of surgery. Drink in one sitting. Do not sip.  o This drink was given to you during your hospital  pre-op appointment visit.  o Nothing else to drink after completing the  10 oz bottle of water.          If you have questions, please contact your surgeon's office.    Take these medicines the morning of surgery with A SIP OF WATER   gabapentin (NEURONTIN)  pantoprazole (PROTONIX)   leflunomide (ARAVA)  cetirizine (ZYRTEC)    Take these medicines if needed  ondansetron (ZOFRAN)  methocarbamol (ROBAXIN)  HYDROcodone-acetaminophen (NORCO)  dicyclomine (BENTYL)  As of today, STOP taking any Aspirin (unless otherwise instructed by your surgeon) Aleve, Naproxen, Ibuprofen, Motrin, Advil, Goody's, BC's, all herbal medications, fish oil, and all vitamins. This includes Celebrex and diclofenac Sodium (VOLTAREN) gel.   WHAT DO I DO ABOUT MY DIABETES MEDICATION?  Hold the following diabetes medications the day before surgery and the morning of  surgery:  canagliflozin [facility])- Do not take the day before and do not take the morning of surgery  empagliflozin (JARDIANCE)- Do not take the day before and do not take the morning of surgery  glipiZIDE (GLUCOTROL XL)- Do not take the morning of surgery   pioglitazone (ACTOS)- Take the usual dose the day before surgery and do not take the morning of surgery  metFORMIN (GLUCOPHAGE)- Do not take the morning of surgery  VICTOZA- Do not take the morning of Surgery.      HOW TO MANAGE YOUR DIABETES BEFORE AND AFTER SURGERY  Why is it important to control my blood sugar before and after surgery? . Improving blood sugar levels before and after surgery helps healing and can limit problems. . A way of improving blood sugar control is eating a healthy diet by: o  Eating less sugar and carbohydrates o  Increasing activity/exercise o  Talking with your doctor about reaching your blood sugar goals . High blood sugars (greater than 180 mg/dL) can raise your risk of infections and slow your recovery, so you will need to focus on controlling your diabetes during the weeks before surgery. . Make sure that the doctor who takes care of your diabetes knows about your planned surgery including the date and location.  How do I manage my blood sugar before surgery? . Check your blood sugar at least 4 times a day, starting 2 days before surgery, to make sure that the level is not too high or low. Marland Kitchen  Check your blood sugar the morning of your surgery when you wake up and every 2 hours until you get to the Short Stay unit. o If your blood sugar is less than 70 mg/dL, you will need to treat for low blood sugar: - Do not take insulin. - Treat a low blood sugar (less than 70 mg/dL) with  cup of clear juice (cranberry or apple), 4 glucose tablets, OR glucose gel. - Recheck blood sugar in 15 minutes after treatment (to make sure it is greater than 70 mg/dL). If your blood sugar is not greater than 70 mg/dL on  recheck, call [phone] for further instructions. . Report your blood sugar to the short stay nurse when you get to Short Stay.  . If you are admitted to the hospital after surgery: o Your blood sugar will be checked by the staff and you will probably be given insulin after surgery (instead of oral diabetes medicines) to make sure you have good blood sugar levels. o The goal for blood sugar control after surgery is 80-180 mg/dL.           The Morning of Surgery:            Do not wear jewelry                       Do not wear lotions, powders, colognes, or deodorant.            Men may shave face and neck.            Do not bring valuables to the hospital.            [facility] is not responsible for any belongings or valuables.  Do NOT Smoke (Tobacco/Vaping) or drink Alcohol 24 hours prior to your procedure If you use a CPAP at night, you may bring all equipment for your overnight stay.   Contacts, glasses, dentures or partials may not be worn into surgery, please bring cases for these belongings   For patients admitted to the hospital, discharge time will be determined by your treatment team.   Patients discharged the day of surgery will not be allowed to drive home, and someone needs to stay with them for 24 hours.    Special instructions:   [place]- Preparing For Surgery  Before surgery, you can play an important role. Because skin is not sterile, your skin needs to be as free of germs as possible. You can reduce the number of germs on your skin by washing with CHG (chlorahexidine gluconate) Soap before surgery.  CHG is an antiseptic cleaner which kills germs and bonds with the skin to continue killing germs even after washing.    Oral Hygiene is also important to reduce your risk of infection.  Remember - BRUSH YOUR TEETH THE MORNING OF SURGERY WITH YOUR REGULAR TOOTHPASTE  Please do not use if you have an allergy to CHG or antibacterial soaps. If your skin becomes  reddened/irritated stop using the CHG.  Do not shave (including legs and underarms) for at least 48 hours prior to first CHG shower. It is OK to shave your face.  Please follow these instructions carefully.   1. Shower the NIGHT BEFORE SURGERY and the MORNING OF SURGERY  2. If you chose to wash your hair, wash your hair first as usual with your normal shampoo.  3. After you shampoo, rinse your hair and body thoroughly to remove the shampoo.  4. Use CHG  Soap as you would any other liquid soap. You can apply CHG directly to the skin and wash gently with a scrungie or a clean washcloth.   5. Apply the CHG Soap to your body ONLY FROM THE NECK DOWN.  Do not use on open wounds or open sores. Avoid contact with your eyes, ears, mouth and genitals (private parts). Wash Face and genitals (private parts)  with your normal soap.   6. Wash thoroughly, paying special attention to the area where your surgery will be performed.  7. Thoroughly rinse your body with warm water from the neck down.  8. DO NOT shower/wash with your normal soap after using and rinsing off the CHG Soap.  9. Pat yourself dry with a CLEAN TOWEL.  10. Wear CLEAN PAJAMAS to bed the night before surgery  11. Place CLEAN SHEETS on your bed the night before your surgery  12. DO NOT SLEEP WITH PETS.   Day of Surgery: Take a shower with CHG soap. Wear Clean/Comfortable clothing the morning of surgery Do not apply any deodorants/lotions.   Remember to brush your teeth WITH YOUR REGULAR TOOTHPASTE.   Please read over the following fact sheets that you were given.

## 2021-01-03 NOTE — Progress Notes (Signed)
PCP - Carylon Perches, MD Cardiologist - Denies  PPM/ICD - Denies  Chest x-ray - N/A EKG - 01/03/21 Stress Test - Denies ECHO - Denies Cardiac Cath - Denies  Sleep Study - Denies  Fasting Blood Sugar - 150-156 Checks Blood Sugar X1 daily  Blood Thinner Instructions: N/A Aspirin Instructions: N/A  ERAS Protcol - Yes PRE-SURGERY Ensure or G2- 10 oz Water given  COVID TEST- 01/03/21; pending   Anesthesia review: No  Patient denies shortness of breath, fever, cough and chest pain at PAT appointment   All instructions explained to the patient, with a verbal understanding of the material. Patient agrees to go over the instructions while at home for a better understanding. Patient also instructed to self quarantine after being tested for COVID-19. The opportunity to ask questions was provided.

## 2021-01-05 ENCOUNTER — Ambulatory Visit (HOSPITAL_COMMUNITY): Payer: Medicare Other | Admitting: Certified Registered Nurse Anesthetist

## 2021-01-05 ENCOUNTER — Encounter (HOSPITAL_COMMUNITY)
Admission: RE | Disposition: A | Discharge status: Home / Self Care | Payer: Self-pay | Source: Home / Self Care | Attending: Orthopedic Surgery

## 2021-01-05 ENCOUNTER — Encounter (HOSPITAL_COMMUNITY): Payer: Self-pay | Admitting: Orthopedic Surgery

## 2021-01-05 ENCOUNTER — Observation Stay (HOSPITAL_COMMUNITY)
Admission: RE | Admit: 2021-01-05 | Discharge: 2021-01-06 | Disposition: A | Discharge status: Home / Self Care | Payer: Medicare Other | Attending: Orthopedic Surgery | Admitting: Orthopedic Surgery

## 2021-01-05 ENCOUNTER — Ambulatory Visit (HOSPITAL_COMMUNITY): Payer: Medicare Other

## 2021-01-05 ENCOUNTER — Other Ambulatory Visit: Payer: Self-pay

## 2021-01-05 DIAGNOSIS — Z7984 Long term (current) use of oral hypoglycemic drugs: Secondary | ICD-10-CM | POA: Insufficient documentation

## 2021-01-05 DIAGNOSIS — Z96642 Presence of left artificial hip joint: Secondary | ICD-10-CM | POA: Insufficient documentation

## 2021-01-05 DIAGNOSIS — Z79899 Other long term (current) drug therapy: Secondary | ICD-10-CM | POA: Diagnosis not present

## 2021-01-05 DIAGNOSIS — M4802 Spinal stenosis, cervical region: Secondary | ICD-10-CM | POA: Diagnosis not present

## 2021-01-05 DIAGNOSIS — M5412 Radiculopathy, cervical region: Secondary | ICD-10-CM | POA: Insufficient documentation

## 2021-01-05 DIAGNOSIS — M4322 Fusion of spine, cervical region: Secondary | ICD-10-CM | POA: Diagnosis not present

## 2021-01-05 DIAGNOSIS — E119 Type 2 diabetes mellitus without complications: Secondary | ICD-10-CM | POA: Diagnosis not present

## 2021-01-05 DIAGNOSIS — M79602 Pain in left arm: Secondary | ICD-10-CM | POA: Diagnosis present

## 2021-01-05 DIAGNOSIS — Z419 Encounter for procedure for purposes other than remedying health state, unspecified: Secondary | ICD-10-CM

## 2021-01-05 DIAGNOSIS — Z981 Arthrodesis status: Secondary | ICD-10-CM | POA: Diagnosis not present

## 2021-01-05 DIAGNOSIS — I10 Essential (primary) hypertension: Secondary | ICD-10-CM | POA: Insufficient documentation

## 2021-01-05 DIAGNOSIS — G549 Nerve root and plexus disorder, unspecified: Secondary | ICD-10-CM | POA: Diagnosis present

## 2021-01-05 HISTORY — PX: ANTERIOR CERVICAL DECOMPRESSION/DISCECTOMY FUSION 4 LEVELS: SHX5556

## 2021-01-05 LAB — GLUCOSE, CAPILLARY
Glucose-Capillary: 158 mg/dL — ABNORMAL HIGH (ref 70–99)
Glucose-Capillary: 125 mg/dL — ABNORMAL HIGH (ref 70–99)
Glucose-Capillary: 211 mg/dL — ABNORMAL HIGH (ref 70–99)
Glucose-Capillary: 169 mg/dL — ABNORMAL HIGH (ref 70–99)
Glucose-Capillary: 188 mg/dL — ABNORMAL HIGH (ref 70–99)

## 2021-01-05 LAB — ABO/RH: ABO/RH(D): O NEG

## 2021-01-05 SURGERY — ANTERIOR CERVICAL DECOMPRESSION/DISCECTOMY FUSION 4 LEVELS
Anesthesia: General | Site: Spine Cervical

## 2021-01-05 MED ORDER — METHOCARBAMOL 750 MG PO TABS
1125.0000 mg | ORAL_TABLET | Freq: Four times a day (QID) | ORAL | Status: DC | PRN
  Administered 2021-01-05 – 2021-01-06 (×3): 1125 mg via ORAL
  Filled 2021-01-05 (×2): qty 2

## 2021-01-05 MED ORDER — LACTATED RINGERS IV SOLN: INTRAVENOUS | Status: DC

## 2021-01-05 MED ORDER — LISINOPRIL 10 MG PO TABS: 10.0000 mg | ORAL_TABLET | Freq: Every day | ORAL | Status: DC

## 2021-01-05 MED ORDER — CANAGLIFLOZIN 300 MG PO TABS: 300.0000 mg | ORAL_TABLET | Freq: Every day | ORAL | Status: DC

## 2021-01-05 MED ORDER — PHENOL 1.4 % MT LIQD
1.0000 | OROMUCOSAL | Status: DC | PRN
Start: 2021-01-05 — End: 2021-01-06

## 2021-01-05 MED ORDER — ACETAMINOPHEN 325 MG PO TABS: 650.0000 mg | ORAL_TABLET | ORAL | Status: DC | PRN

## 2021-01-05 MED ORDER — PANTOPRAZOLE SODIUM 40 MG IV SOLR: 40.0000 mg | Freq: Every day | INTRAVENOUS | Status: DC

## 2021-01-05 MED ORDER — HYDROMORPHONE HCL 1 MG/ML IJ SOLN
INTRAMUSCULAR | Status: AC
  Filled 2021-01-05: qty 0.5

## 2021-01-05 MED ORDER — LIDOCAINE 2% (20 MG/ML) 5 ML SYRINGE
INTRAMUSCULAR | Status: DC | PRN
  Administered 2021-01-05: 60 mg via INTRAVENOUS

## 2021-01-05 MED ORDER — TAMSULOSIN HCL 0.4 MG PO CAPS
0.4000 mg | ORAL_CAPSULE | Freq: Every day | ORAL | Status: DC
  Administered 2021-01-05: 0.4 mg via ORAL
  Filled 2021-01-05: qty 1

## 2021-01-05 MED ORDER — PROPOFOL 10 MG/ML IV BOLUS
INTRAVENOUS | Status: AC
  Filled 2021-01-05: qty 40

## 2021-01-05 MED ORDER — SODIUM CHLORIDE 0.9 % IV SOLN: 250.0000 mL | INTRAVENOUS | Status: DC

## 2021-01-05 MED ORDER — PHENYLEPHRINE HCL-NACL 10-0.9 MG/250ML-% IV SOLN
INTRAVENOUS | Status: DC | PRN
  Administered 2021-01-05: 30 ug/min via INTRAVENOUS
  Administered 2021-01-05: 20 ug/min via INTRAVENOUS

## 2021-01-05 MED ORDER — MIDAZOLAM HCL 2 MG/2ML IJ SOLN
INTRAMUSCULAR | Status: DC | PRN
  Administered 2021-01-05: 2 mg via INTRAVENOUS

## 2021-01-05 MED ORDER — EMPAGLIFLOZIN 25 MG PO TABS
25.0000 mg | ORAL_TABLET | Freq: Every day | ORAL | Status: DC
  Filled 2021-01-05: qty 1

## 2021-01-05 MED ORDER — HYDROMORPHONE HCL 1 MG/ML IJ SOLN
INTRAMUSCULAR | Status: AC
  Filled 2021-01-05: qty 1

## 2021-01-05 MED ORDER — ROCURONIUM BROMIDE 10 MG/ML (PF) SYRINGE
PREFILLED_SYRINGE | INTRAVENOUS | Status: DC | PRN
  Administered 2021-01-05: 100 mg via INTRAVENOUS
  Administered 2021-01-05: 50 mg via INTRAVENOUS

## 2021-01-05 MED ORDER — PHENYLEPHRINE 40 MCG/ML (10ML) SYRINGE FOR IV PUSH (FOR BLOOD PRESSURE SUPPORT)
PREFILLED_SYRINGE | INTRAVENOUS | Status: DC | PRN
  Administered 2021-01-05: 80 ug via INTRAVENOUS
  Administered 2021-01-05: 40 ug via INTRAVENOUS
  Administered 2021-01-05: 80 ug via INTRAVENOUS

## 2021-01-05 MED ORDER — THROMBIN 20000 UNITS EX SOLR
CUTANEOUS | Status: AC
  Filled 2021-01-05: qty 20000

## 2021-01-05 MED ORDER — BUPIVACAINE-EPINEPHRINE (PF) 0.25% -1:200000 IJ SOLN
INTRAMUSCULAR | Status: AC
  Filled 2021-01-05: qty 30

## 2021-01-05 MED ORDER — HYDROCODONE-ACETAMINOPHEN 10-325 MG PO TABS
1.0000 | ORAL_TABLET | ORAL | Status: DC | PRN
  Administered 2021-01-06: 1 via ORAL

## 2021-01-05 MED ORDER — ZOLPIDEM TARTRATE 5 MG PO TABS: 5.0000 mg | ORAL_TABLET | Freq: Every evening | ORAL | Status: DC | PRN

## 2021-01-05 MED ORDER — PIOGLITAZONE HCL 30 MG PO TABS
30.0000 mg | ORAL_TABLET | Freq: Every day | ORAL | Status: DC
  Filled 2021-01-05: qty 1

## 2021-01-05 MED ORDER — HYDROMORPHONE HCL 1 MG/ML IJ SOLN
INTRAMUSCULAR | Status: DC | PRN
  Administered 2021-01-05: .5 mg via INTRAVENOUS

## 2021-01-05 MED ORDER — DOCUSATE SODIUM 100 MG PO CAPS
100.0000 mg | ORAL_CAPSULE | Freq: Two times a day (BID) | ORAL | Status: DC
  Administered 2021-01-05: 100 mg via ORAL
  Filled 2021-01-05: qty 1

## 2021-01-05 MED ORDER — DEXMEDETOMIDINE (PRECEDEX) IN NS 20 MCG/5ML (4 MCG/ML) IV SYRINGE
PREFILLED_SYRINGE | INTRAVENOUS | Status: DC | PRN
  Administered 2021-01-05: 12 ug via INTRAVENOUS

## 2021-01-05 MED ORDER — ACETAMINOPHEN 650 MG RE SUPP
650.0000 mg | RECTAL | Status: DC | PRN
Start: 2021-01-05 — End: 2021-01-06

## 2021-01-05 MED ORDER — BUPIVACAINE-EPINEPHRINE 0.25% -1:200000 IJ SOLN
INTRAMUSCULAR | Status: DC | PRN
  Administered 2021-01-05: 5 mL

## 2021-01-05 MED ORDER — GABAPENTIN 400 MG PO CAPS
400.0000 mg | ORAL_CAPSULE | Freq: Four times a day (QID) | ORAL | Status: DC
  Administered 2021-01-05: 400 mg via ORAL
  Filled 2021-01-05: qty 1

## 2021-01-05 MED ORDER — FENTANYL CITRATE (PF) 100 MCG/2ML IJ SOLN
INTRAMUSCULAR | Status: DC | PRN
  Administered 2021-01-05: 50 ug via INTRAVENOUS
  Administered 2021-01-05: 100 ug via INTRAVENOUS
  Administered 2021-01-05 (×2): 50 ug via INTRAVENOUS

## 2021-01-05 MED ORDER — METHOCARBAMOL 500 MG PO TABS
ORAL_TABLET | ORAL | Status: AC
  Filled 2021-01-05: qty 2

## 2021-01-05 MED ORDER — THROMBIN 20000 UNITS EX SOLR
CUTANEOUS | Status: DC | PRN
  Administered 2021-01-05: 20000 [IU] via TOPICAL

## 2021-01-05 MED ORDER — PROPOFOL 10 MG/ML IV BOLUS
INTRAVENOUS | Status: DC | PRN
  Administered 2021-01-05: 20 mg via INTRAVENOUS
  Administered 2021-01-05: 30 mg via INTRAVENOUS
  Administered 2021-01-05: 150 mg via INTRAVENOUS

## 2021-01-05 MED ORDER — ONDANSETRON HCL 4 MG PO TABS: 4.0000 mg | ORAL_TABLET | Freq: Four times a day (QID) | ORAL | Status: DC | PRN

## 2021-01-05 MED ORDER — ORAL CARE MOUTH RINSE: 15.0000 mL | Freq: Once | OROMUCOSAL | Status: AC

## 2021-01-05 MED ORDER — PROMETHAZINE HCL 25 MG/ML IJ SOLN: 6.2500 mg | INTRAMUSCULAR | Status: DC | PRN

## 2021-01-05 MED ORDER — DICYCLOMINE HCL 10 MG PO CAPS: 10.0000 mg | ORAL_CAPSULE | Freq: Four times a day (QID) | ORAL | Status: DC | PRN

## 2021-01-05 MED ORDER — LEFLUNOMIDE 20 MG PO TABS
20.0000 mg | ORAL_TABLET | Freq: Every day | ORAL | Status: DC
  Filled 2021-01-05: qty 1

## 2021-01-05 MED ORDER — SODIUM CHLORIDE 0.9% FLUSH: 3.0000 mL | INTRAVENOUS | Status: DC | PRN

## 2021-01-05 MED ORDER — METFORMIN HCL 500 MG PO TABS
1000.0000 mg | ORAL_TABLET | Freq: Two times a day (BID) | ORAL | Status: DC
  Administered 2021-01-05: 1000 mg via ORAL
  Filled 2021-01-05: qty 2

## 2021-01-05 MED ORDER — CHLORHEXIDINE GLUCONATE 0.12 % MT SOLN
15.0000 mL | Freq: Once | OROMUCOSAL | Status: AC
  Administered 2021-01-05: 15 mL via OROMUCOSAL
  Filled 2021-01-05: qty 15

## 2021-01-05 MED ORDER — LIRAGLUTIDE 18 MG/3ML ~~LOC~~ SOPN: 1.8000 mg | PEN_INJECTOR | Freq: Every evening | SUBCUTANEOUS | Status: DC

## 2021-01-05 MED ORDER — ACETAMINOPHEN 500 MG PO TABS
1000.0000 mg | ORAL_TABLET | Freq: Once | ORAL | Status: AC
  Administered 2021-01-05: 1000 mg via ORAL
  Filled 2021-01-05: qty 2

## 2021-01-05 MED ORDER — INSULIN ASPART 100 UNIT/ML IJ SOLN
4.0000 [IU] | Freq: Once | INTRAMUSCULAR | Status: AC
  Administered 2021-01-05: 4 [IU] via SUBCUTANEOUS

## 2021-01-05 MED ORDER — FLEET ENEMA 7-19 GM/118ML RE ENEM: 1.0000 | ENEMA | Freq: Once | RECTAL | Status: DC | PRN

## 2021-01-05 MED ORDER — EPHEDRINE SULFATE-NACL 50-0.9 MG/10ML-% IV SOSY
PREFILLED_SYRINGE | INTRAVENOUS | Status: DC | PRN
  Administered 2021-01-05 (×2): 5 mg via INTRAVENOUS

## 2021-01-05 MED ORDER — CEFAZOLIN SODIUM-DEXTROSE 2-4 GM/100ML-% IV SOLN
2.0000 g | Freq: Three times a day (TID) | INTRAVENOUS | Status: AC
  Administered 2021-01-05 – 2021-01-06 (×2): 2 g via INTRAVENOUS
  Filled 2021-01-05 (×2): qty 100

## 2021-01-05 MED ORDER — POVIDONE-IODINE 7.5 % EX SOLN: Freq: Once | CUTANEOUS | Status: DC

## 2021-01-05 MED ORDER — SENNOSIDES-DOCUSATE SODIUM 8.6-50 MG PO TABS: 1.0000 | ORAL_TABLET | Freq: Every evening | ORAL | Status: DC | PRN

## 2021-01-05 MED ORDER — GLIPIZIDE ER 10 MG PO TB24
10.0000 mg | ORAL_TABLET | Freq: Every day | ORAL | Status: DC
  Filled 2021-01-05: qty 1

## 2021-01-05 MED ORDER — INSULIN ASPART 100 UNIT/ML IJ SOLN
INTRAMUSCULAR | Status: AC
  Filled 2021-01-05: qty 1

## 2021-01-05 MED ORDER — MIDAZOLAM HCL 2 MG/2ML IJ SOLN
INTRAMUSCULAR | Status: AC
  Filled 2021-01-05: qty 2

## 2021-01-05 MED ORDER — SUGAMMADEX SODIUM 200 MG/2ML IV SOLN
INTRAVENOUS | Status: DC | PRN
  Administered 2021-01-05: 300 mg via INTRAVENOUS

## 2021-01-05 MED ORDER — SODIUM CHLORIDE 0.9% FLUSH: 3.0000 mL | Freq: Two times a day (BID) | INTRAVENOUS | Status: DC

## 2021-01-05 MED ORDER — HYDROCODONE-ACETAMINOPHEN 7.5-325 MG PO TABS
ORAL_TABLET | ORAL | Status: AC
  Filled 2021-01-05: qty 1

## 2021-01-05 MED ORDER — FENTANYL CITRATE (PF) 250 MCG/5ML IJ SOLN
INTRAMUSCULAR | Status: AC
  Filled 2021-01-05: qty 5

## 2021-01-05 MED ORDER — PANTOPRAZOLE SODIUM 40 MG PO TBEC
40.0000 mg | DELAYED_RELEASE_TABLET | Freq: Every day | ORAL | Status: DC
  Administered 2021-01-05: 40 mg via ORAL
  Filled 2021-01-05: qty 1

## 2021-01-05 MED ORDER — MENTHOL 3 MG MT LOZG
1.0000 | LOZENGE | OROMUCOSAL | Status: DC | PRN
  Filled 2021-01-05: qty 9

## 2021-01-05 MED ORDER — HYDROCODONE-ACETAMINOPHEN 10-325 MG PO TABS
2.0000 | ORAL_TABLET | ORAL | Status: DC | PRN
Start: 2021-01-05 — End: 2021-01-06
  Administered 2021-01-05 – 2021-01-06 (×3): 2 via ORAL
  Filled 2021-01-05 (×4): qty 2

## 2021-01-05 MED ORDER — CEFAZOLIN SODIUM 1 G IJ SOLR
INTRAMUSCULAR | Status: AC
  Filled 2021-01-05: qty 20

## 2021-01-05 MED ORDER — ONDANSETRON HCL 4 MG PO TABS: 4.0000 mg | ORAL_TABLET | Freq: Three times a day (TID) | ORAL | Status: DC | PRN

## 2021-01-05 MED ORDER — ALUM & MAG HYDROXIDE-SIMETH 200-200-20 MG/5ML PO SUSP: 30.0000 mL | Freq: Four times a day (QID) | ORAL | Status: DC | PRN

## 2021-01-05 MED ORDER — BISACODYL 5 MG PO TBEC: 5.0000 mg | DELAYED_RELEASE_TABLET | Freq: Every day | ORAL | Status: DC | PRN

## 2021-01-05 MED ORDER — HYDROMORPHONE HCL 1 MG/ML IJ SOLN
0.2500 mg | INTRAMUSCULAR | Status: DC | PRN
  Administered 2021-01-05 (×3): 0.5 mg via INTRAVENOUS

## 2021-01-05 MED ORDER — HYDROCODONE-ACETAMINOPHEN 7.5-325 MG PO TABS
1.0000 | ORAL_TABLET | Freq: Once | ORAL | Status: AC | PRN
  Administered 2021-01-05: 1 via ORAL

## 2021-01-05 MED ORDER — HEMOSTATIC AGENTS (NO CHARGE) OPTIME
TOPICAL | Status: DC | PRN
  Administered 2021-01-05: 1 via TOPICAL

## 2021-01-05 MED ORDER — ONDANSETRON HCL 4 MG/2ML IJ SOLN: 4.0000 mg | Freq: Four times a day (QID) | INTRAMUSCULAR | Status: DC | PRN

## 2021-01-05 MED ORDER — LORATADINE 10 MG PO TABS: 10.0000 mg | ORAL_TABLET | Freq: Every day | ORAL | Status: DC

## 2021-01-05 MED ORDER — CEFAZOLIN SODIUM-DEXTROSE 2-4 GM/100ML-% IV SOLN
2.0000 g | INTRAVENOUS | Status: AC
  Administered 2021-01-05: 2 g via INTRAVENOUS
  Filled 2021-01-05: qty 100

## 2021-01-05 MED ORDER — LACTATED RINGERS IV SOLN: INTRAVENOUS | Status: DC | PRN

## 2021-01-05 SURGICAL SUPPLY — 77 items
BENZOIN TINCTURE PRP APPL 2/3 (GAUZE/BANDAGES/DRESSINGS) ×2 IMPLANT
BIT DRILL NEURO 2X3.1 SFT TUCH (MISCELLANEOUS) ×1 IMPLANT
BIT DRILL SRG 14X2.2XFLT CHK (BIT) ×1 IMPLANT
BIT DRL SRG 14X2.2XFLT CHK (BIT) ×1
BLADE CLIPPER SURG (BLADE) ×2 IMPLANT
BLADE SURG 15 STRL LF DISP TIS (BLADE) ×1 IMPLANT
BLADE SURG 15 STRL SS (BLADE) ×2
BUR MATCHSTICK NEURO 3.0 LAGG (BURR) IMPLANT
CARTRIDGE OIL MAESTRO DRILL (MISCELLANEOUS) ×1 IMPLANT
CORD BIPOLAR FORCEPS 12FT (ELECTRODE) ×2 IMPLANT
COVER SURGICAL LIGHT HANDLE (MISCELLANEOUS) ×2 IMPLANT
COVER WAND RF STERILE (DRAPES) ×2 IMPLANT
DECANTER SPIKE VIAL GLASS SM (MISCELLANEOUS) ×2 IMPLANT
DEVICE ENDSKLTN MED 6 7MM (Orthopedic Implant) ×1 IMPLANT
DIFFUSER DRILL AIR PNEUMATIC (MISCELLANEOUS) ×2 IMPLANT
DRAIN JACKSON RD 7FR 3/32 (WOUND CARE) IMPLANT
DRAPE C-ARM 42X72 X-RAY (DRAPES) ×2 IMPLANT
DRAPE POUCH INSTRU U-SHP 10X18 (DRAPES) ×2 IMPLANT
DRAPE SURG 17X23 STRL (DRAPES) ×6 IMPLANT
DRILL BIT SKYLINE 14MM (BIT) ×2
DRILL NEURO 2X3.1 SOFT TOUCH (MISCELLANEOUS) ×2
DURAPREP 26ML APPLICATOR (WOUND CARE) ×2 IMPLANT
ELECT COATED BLADE 2.86 ST (ELECTRODE) ×2 IMPLANT
ELECT REM PT RETURN 9FT ADLT (ELECTROSURGICAL) ×2
ELECTRODE REM PT RTRN 9FT ADLT (ELECTROSURGICAL) ×1 IMPLANT
ENDOSKELETON MED 6 7MM (Orthopedic Implant) ×2 IMPLANT
EVACUATOR SILICONE 100CC (DRAIN) IMPLANT
GAUZE 4X4 16PLY RFD (DISPOSABLE) ×2 IMPLANT
GAUZE SPONGE 4X4 12PLY STRL (GAUZE/BANDAGES/DRESSINGS) ×2 IMPLANT
GAUZE SPONGE 4X4 12PLY STRL LF (GAUZE/BANDAGES/DRESSINGS) ×2 IMPLANT
GLOVE BIO SURGEON STRL SZ7 (GLOVE) ×2 IMPLANT
GLOVE BIO SURGEON STRL SZ8 (GLOVE) ×2 IMPLANT
GLOVE SRG 8 PF TXTR STRL LF DI (GLOVE) ×1 IMPLANT
GLOVE SURG UNDER POLY LF SZ7 (GLOVE) ×4 IMPLANT
GLOVE SURG UNDER POLY LF SZ8 (GLOVE) ×2
GOWN STRL REUS W/ TWL LRG LVL3 (GOWN DISPOSABLE) ×1 IMPLANT
GOWN STRL REUS W/ TWL XL LVL3 (GOWN DISPOSABLE) ×1 IMPLANT
GOWN STRL REUS W/TWL LRG LVL3 (GOWN DISPOSABLE) ×2
GOWN STRL REUS W/TWL XL LVL3 (GOWN DISPOSABLE) ×2
INTERLOCK LRDTC CRVCL VBR 7MM (Bone Implant) ×1 IMPLANT
INTERLOCK LRDTC CRVCL VBR 8MM (Peek) ×1 IMPLANT
IV CATH 14GX2 1/4 (CATHETERS) ×2 IMPLANT
KIT BASIN OR (CUSTOM PROCEDURE TRAY) ×2 IMPLANT
KIT TURNOVER KIT B (KITS) ×2 IMPLANT
LORDOTIC CERVICAL VBR 7MM SM (Bone Implant) ×2 IMPLANT
LORDOTIC CERVICAL VBR 8MM SM (Peek) ×2 IMPLANT
MANIFOLD NEPTUNE II (INSTRUMENTS) ×2 IMPLANT
NEEDLE PRECISIONGLIDE 27X1.5 (NEEDLE) ×2 IMPLANT
NEEDLE SPNL 20GX3.5 QUINCKE YW (NEEDLE) ×2 IMPLANT
NS IRRIG 1000ML POUR BTL (IV SOLUTION) ×2 IMPLANT
OIL CARTRIDGE MAESTRO DRILL (MISCELLANEOUS) ×2
PACK ORTHO CERVICAL (CUSTOM PROCEDURE TRAY) ×2 IMPLANT
PAD ARMBOARD 7.5X6 YLW CONV (MISCELLANEOUS) ×4 IMPLANT
PATTIES SURGICAL .5 X.5 (GAUZE/BANDAGES/DRESSINGS) IMPLANT
PATTIES SURGICAL .5 X1 (DISPOSABLE) IMPLANT
PIN DISTRACTION 14 (PIN) ×4 IMPLANT
PLATE SKYLINE 3LVL 54MM SPINAL (Plate) ×2 IMPLANT
POSITIONER HEAD DONUT 9IN (MISCELLANEOUS) ×2 IMPLANT
PUTTY BONE DBX 5CC MIX (Putty) ×2 IMPLANT
SCREW SKYLINE VAR OS 14MM (Screw) ×12 IMPLANT
SCREW VAR SELF TAP SKYLINE 14M (Screw) ×4 IMPLANT
SPONGE INTESTINAL PEANUT (DISPOSABLE) ×6 IMPLANT
SPONGE SURGIFOAM ABS GEL 100 (HEMOSTASIS) ×2 IMPLANT
STRIP CLOSURE SKIN 1/2X4 (GAUZE/BANDAGES/DRESSINGS) ×2 IMPLANT
SURGIFLO W/THROMBIN 8M KIT (HEMOSTASIS) IMPLANT
SUT BONE WAX W31G (SUTURE) ×2 IMPLANT
SUT MNCRL AB 4-0 PS2 18 (SUTURE) ×2 IMPLANT
SUT VIC AB 2-0 CT2 18 VCP726D (SUTURE) ×2 IMPLANT
SYR BULB IRRIG 60ML STRL (SYRINGE) ×2 IMPLANT
SYR CONTROL 10ML LL (SYRINGE) ×4 IMPLANT
TAPE CLOTH 4X10 WHT NS (GAUZE/BANDAGES/DRESSINGS) ×2 IMPLANT
TAPE UMBILICAL COTTON 1/8X30 (MISCELLANEOUS) ×2 IMPLANT
TOWEL GREEN STERILE (TOWEL DISPOSABLE) ×2 IMPLANT
TOWEL GREEN STERILE FF (TOWEL DISPOSABLE) ×2 IMPLANT
TRAY FOLEY MTR SLVR 16FR STAT (SET/KITS/TRAYS/PACK) ×2 IMPLANT
WATER STERILE IRR 1000ML POUR (IV SOLUTION) ×2 IMPLANT
YANKAUER SUCT BULB TIP NO VENT (SUCTIONS) ×2 IMPLANT

## 2021-01-05 NOTE — H&P (Signed)
PREOPERATIVE H&P  Chief Complaint: Left arm pain  HPI: Steve Mccormick is a 60 y.o. male who presents with ongoing pain in the left arm  MRI reveals spinal stenosis spanning C3-C6  Patient has failed multiple forms of conservative care and continues to have pain (see office notes for additional details regarding the patient's full course of treatment)  Past Medical History:  Diagnosis Date  . Diabetes mellitus   . Fatty liver   . GERD (gastroesophageal reflux disease)   . Hypertension   . Psoriatic arthritis (HCC)    psoriatic and RA  . Tinnitus   . Wears hearing aid    Past Surgical History:  Procedure Laterality Date  . ANKLE SURGERY     bilateral  . BIOPSY  08/13/2019   Procedure: BIOPSY;  Surgeon: Corbin Ade, MD;  Location: [facility];  Service: Endoscopy;;  gastric esophageal  . COLONOSCOPY N/A 10/22/2012   Dr. Jena Gauss: colonic diverticulosis. next TCS in 5 years due to [facility] CRC, colon polyps  . COLONOSCOPY WITH PROPOFOL N/A 08/13/2019   Dr. Jena Gauss: diverticulosis, long redundant colon. due to personal history of polyps, next TCS in five years  . ESOPHAGOGASTRODUODENOSCOPY (EGD) WITH PROPOFOL N/A 08/13/2019   Dr. Jena Gauss: 1cm salmon colored epithelium in distal esophagus (bx benign with no Barrett's), benign gastric polyp.  . Ileocolonoscopy  10/02/2007   RMR: Friable anal canal, otherwise normal rectum.  Left-sided diverticula.  The colonic mucosa and  terminal ileum mucosa appeared normal rectum, left-sided diverticula  . KNEE SURGERY     bilateral  . plantar fasciitis Bilateral   . SHOULDER SURGERY     bilateral  . TOTAL HIP ARTHROPLASTY Left 09/01/2019   Procedure: TOTAL HIP ARTHROPLASTY ANTERIOR APPROACH;  Surgeon: Sheral Apley, MD;  Location: [facility] ORS;  Service: Orthopedics;  Laterality: Left;   Social History   Socioeconomic History  . Marital status: Married    Spouse name: Not on file  . Number of children: Not on file  . Years of education:  Not on file  . Highest education level: Not on file  Occupational History  . Occupation: Training and development officer: [employer]  Tobacco Use  . Smoking status: Never Smoker  . Smokeless tobacco: Never Used  Vaping Use  . Vaping Use: Never used  Substance and Sexual Activity  . Alcohol use: No  . Drug use: No  . Sexual activity: Yes  Other Topics Concern  . Not on file  Social History Narrative  . Not on file   Social Determinants of Health   Financial Resource Strain: Not on file  Food Insecurity: Not on file  Transportation Needs: Not on file  Physical Activity: Not on file  Stress: Not on file  Social Connections: Not on file   Family History  Problem Relation Age of Onset  . Colon cancer Maternal Grandmother   . Colon polyps Mother   . Diabetes Mother   . Hypertension Mother   . Hyperlipidemia Mother   . Diabetes Father    Allergies  Allergen Reactions  . Fish Allergy Anaphylaxis, Swelling and Other (See Comments)    Shaky   . Percocet [Oxycodone-Acetaminophen] Itching   Prior to Admission medications   Medication Sig Start Date End Date Taking? Authorizing Provider  canagliflozin (INVOKANA) 300 MG TABS tablet Take 300 mg by mouth daily before breakfast.   Yes [provider]  celecoxib (CELEBREX) 200 MG capsule Take 200 mg by  mouth 2 (two) times daily.   Yes [provider]  cetirizine (ZYRTEC) 10 MG tablet Take 10 mg by mouth daily. Allergies   Yes [provider]  diclofenac Sodium (VOLTAREN) 1 % GEL Apply 1 application topically 4 (four) times daily as needed (hand pain).   Yes [provider]  dicyclomine (BENTYL) 10 MG capsule TAKE 1 CAPSULE BY MOUTH UP TO 4 TIMES A DAY FOR CRAMPS/DIARRHEA (HOLD FOR CONSTIPATION) Patient taking differently: Take 10 mg by mouth 4 (four) times daily as needed (Cramps). 09/01/19  Yes Tiffany Kocher, PA-C  empagliflozin (JARDIANCE) 25 MG TABS tablet Take 25 mg by mouth daily.   Yes  [provider]  gabapentin (NEURONTIN) 400 MG capsule Take 400 mg by mouth 4 (four) times daily.    Yes [provider]  glipiZIDE (GLUCOTROL XL) 10 MG 24 hr tablet Take 10 mg by mouth daily. 06/19/19  Yes [provider]  HYDROcodone-acetaminophen (NORCO) 10-325 MG tablet Take 1 tablet by mouth 4 (four) times daily.   Yes [provider]  leflunomide (ARAVA) 20 MG tablet Take 20 mg by mouth daily. 11/14/20  Yes [provider]  lisinopril (ZESTRIL) 10 MG tablet Take 10 mg by mouth daily. 06/23/19  Yes [provider]  metFORMIN (GLUCOPHAGE) 1000 MG tablet Take 1,000 mg by mouth 2 (two) times daily. 06/23/19  Yes [provider]  methocarbamol (ROBAXIN) 750 MG tablet Take 1,125 mg by mouth 4 (four) times daily as needed for muscle spasms.   Yes [provider]  Multiple Vitamin (MULTIVITAMIN WITH MINERALS) TABS tablet Take 1 tablet by mouth every evening.   Yes [provider]  pantoprazole (PROTONIX) 40 MG tablet TAKE 1 TABLET BY MOUTH  DAILY BEFORE BREAKFAST Patient taking differently: Take 40 mg by mouth daily. 11/07/20  Yes Ermalinda Memos S, PA-C  pioglitazone (ACTOS) 30 MG tablet Take 30 mg by mouth daily. 11/04/20  Yes [provider]  tamsulosin (FLOMAX) 0.4 MG CAPS capsule Take 0.4 mg by mouth at bedtime.   Yes [provider]  VICTOZA 18 MG/3ML SOPN Inject 1.8 mg into the skin every evening. 07/16/19  Yes [provider]  inFLIXimab (REMICADE IV) Inject 1 Dose into the vein every 6 (six) weeks.     [provider]  ondansetron (ZOFRAN) 4 MG tablet Take 1 tablet (4 mg total) by mouth every 8 (eight) hours as needed for nausea or vomiting. 09/01/19   Albina Billet III, PA-C     All other systems have been reviewed and were otherwise negative with the exception of those mentioned in the HPI and as above.  Physical Exam: Vitals:   01/05/21 1010  BP: (!) 149/82   Pulse: 67  Resp: 18  Temp: 97.6 F (36.4 C)  SpO2: 96%    Body mass index is 32.78 kg/m.  General: Alert, no acute distress Cardiovascular: No pedal edema Respiratory: No cyanosis, no use of accessory musculature Skin: No lesions in the area of chief complaint Neurologic: Sensation intact distally Psychiatric: Patient is competent for consent with normal mood and affect Lymphatic: No axillary or cervical lymphadenopathy   Assessment/Plan: NECK PAIN AND LEFT - SIDED CERVICAL RADICULOPATHY Plan for Procedure(s): ANTERIOR CERVICAL DECOMPRESSION FUSION CERVICAL 3- CERVICAL4, CERVICAL 4- CERVICAL 5, CERVICAL, CERVICAL 5- CERVICAL 6 WITH INSTRUMENTATION AND ALLOGRAFT   Jackelyn Hoehn, MD 01/05/2021 12:27 PM

## 2021-01-05 NOTE — Anesthesia Procedure Notes (Signed)
Procedure Name: Intubation Date/Time: 01/05/2021 1:42 PM Performed by: Jodell Cipro, CRNA Pre-anesthesia Checklist: Patient identified, Emergency Drugs available, Suction available and Patient being monitored Patient Re-evaluated:Patient Re-evaluated prior to induction Oxygen Delivery Method: Circle System Utilized Preoxygenation: Pre-oxygenation with 100% oxygen Induction Type: IV induction Ventilation: Mask ventilation without difficulty and Oral airway inserted - appropriate to patient size Laryngoscope Size: Glidescope and 4 Grade View: Grade I Tube type: Oral Number of attempts: 1 Airway Equipment and Method: Oral airway,  Video-laryngoscopy and Rigid stylet Placement Confirmation: ETT inserted through vocal cords under direct vision,  positive ETCO2 and breath sounds checked- equal and bilateral Secured at: 23 cm Tube secured with: Tape Dental Injury: Teeth and Oropharynx as per pre-operative assessment  Difficulty Due To: Difficult Airway- due to reduced neck mobility

## 2021-01-05 NOTE — Op Note (Signed)
PATIENT NAME: Steve Mccormick   MEDICAL RECORD NO.:   [id number]    DATE OF BIRTH: 1961-06-23   DATE OF PROCEDURE: 01/05/2021                               OPERATIVE REPORT     PREOPERATIVE DIAGNOSES: 1. Left-sided cervical radiculopathy. 2. Spinal stenosis spanning C3-C6.   POSTOPERATIVE DIAGNOSES: 1. Left-sided cervical radiculopathy. 2. Spinal stenosis spanning C3-C6.   PROCEDURE: 1. Anterior cervical decompression and fusion C3/4, C4/5, C5/6 2. Placement of anterior instrumentation, C3-C6 3. Insertion of interbody device x 3 (Titan intervertebral spacers). 4. Intraoperative use of fluoroscopy. 5. Use of morselized allograft - DBX-mix.   SURGEON:  Estill Bamberg, MD   ASSISTANT:  Jason Coop, PA-C.   ANESTHESIA:  General endotracheal anesthesia.   COMPLICATIONS:  None.   DISPOSITION:  Stable.   ESTIMATED BLOOD LOSS:  Minimal.   INDICATIONS FOR SURGERY:  Briefly, Steve Mccormick is a pleasant 60 y.o. year- old patient, who did present to me with severe pain in the neck and left arm.  The patient's MRI did reveal the findings noted above.  Given the patient's ongoing rather debilitating pain and lack of improvement with appropriate treatment measures, we did discuss proceeding with the procedure noted above.  The patient was fully aware of the risks and limitations of surgery as outlined in my preoperative note.   OPERATIVE DETAILS:  On 01/05/2021, the patient was brought to surgery and general endotracheal anesthesia was administered.  The patient was placed supine on the hospital bed. The neck was gently extended.  All bony prominences were meticulously padded.  The neck was prepped and draped in the usual sterile fashion.  At this point, I did make a left-sided transverse incision.  The platysma was incised.  A Smith-Robinson approach was used and the anterior spine was identified. A self-retaining retractor was placed.  I then subperiosteally exposed the  vertebral bodies from C3-C6.  Caspar pins were then placed into the C5 and C6 vertebral bodies and distraction was applied.  A thorough and complete C5-6 intervertebral diskectomy was performed.  The posterior longitudinal ligament was identified and entered using a nerve hook.  I then used #1 followed by #2 Kerrison to perform a thorough and complete intervertebral diskectomy.  The spinal canal was thoroughly decompressed, as was the right and left neuroforamen.  The endplates were then prepared and the appropriate-sized intervertebral spacer was then packed with DBX-mix and tamped into position in the usual fashion.  The lower Caspar pin was then removed and placed into the C4 vertebral body and once again, distraction was applied across the C4-5 intervertebral space.  I then again performed a thorough and complete diskectomy, thoroughly decompressing the spinal canal and bilateral neuroforamena.  After preparing the endplates, the appropriate-sized intervertebral spacer was packed with DBX-mix and tamped into position.  The lower Caspar pin was then removed and placed into the C3 vertebral body and once again, distraction was applied across the C3-4 intervertebral space.  I then again performed a thorough and complete diskectomy, thoroughly decompressing the spinal canal and bilateral neuroforamena.  After preparing the endplates, the appropriate-sized intervertebral spacer was packed with DBX-mix and tamped into position.  The Caspar pins then were removed and bone wax was placed in their place.  The appropriate-sized anterior cervical plate was placed over the anterior spine.  14 mm variable angle screws were placed, 2 in  each vertebral body from C3-C6 for a total of 8 vertebral body screws.  The screws were then locked to the plate using the Cam locking mechanism.  I was very pleased with the final fluoroscopic images.  The wound was then irrigated.  The wound was then explored for  any undue bleeding and there was no bleeding noted. The wound was then closed in layers using 2-0 Vicryl, followed by 4-0 Monocryl.  Benzoin and Steri-Strips were applied, followed by sterile dressing.  All instrument counts were correct at the termination of the procedure.   Of note, Jason Coop, PA-C, was my assistant throughout surgery, and did aid in retraction, suctioning, placement of the hardware, and closure from start to finish.         Estill Bamberg, MD

## 2021-01-05 NOTE — Anesthesia Preprocedure Evaluation (Addendum)
Anesthesia Evaluation  Patient identified by MRN, date of birth, ID band Patient awake    Reviewed: Allergy & Precautions, NPO status , Patient's Chart, lab work & pertinent test results  Airway Mallampati: III  TM Distance: >3 FB Neck ROM: Full    Dental no notable dental hx. (+) Teeth Intact, Dental Advisory Given   Pulmonary neg pulmonary ROS,    Pulmonary exam normal breath sounds clear to auscultation       Cardiovascular hypertension, Pt. on medications Normal cardiovascular exam Rhythm:Regular Rate:Normal     Neuro/Psych negative psych ROS   GI/Hepatic Neg liver ROS, GERD  Medicated and Controlled,  Endo/Other  diabetes, Well Controlled, Type 2, Oral Hypoglycemic AgentsObesity BMI 34 a1c 7.7  Renal/GU negative Renal ROS  negative genitourinary   Musculoskeletal  (+) Arthritis , Osteoarthritis,    Abdominal (+) + obese,   Peds negative pediatric ROS (+)  Hematology negative hematology ROS (+)   Anesthesia Other Findings   Reproductive/Obstetrics negative OB ROS                           Anesthesia Physical Anesthesia Plan  ASA: II  Anesthesia Plan: General   Post-op Pain Management:    Induction: Intravenous  PONV Risk Score and Plan: Ondansetron, Dexamethasone, Treatment may vary due to age or medical condition and Midazolam  Airway Management Planned: Oral ETT and Video Laryngoscope Planned  Additional Equipment: None  Intra-op Plan:   Post-operative Plan: Extubation in OR  Informed Consent: I have reviewed the patients History and Physical, chart, labs and discussed the procedure including the risks, benefits and alternatives for the proposed anesthesia with the patient or authorized representative who has indicated his/her understanding and acceptance.     Dental advisory given  Plan Discussed with: CRNA  Anesthesia Plan Comments:         Anesthesia  Quick Evaluation

## 2021-01-05 NOTE — Transfer of Care (Signed)
Immediate Anesthesia Transfer of Care Note  Patient: Steve Mccormick  Procedure(s) Performed: ANTERIOR CERVICAL DECOMPRESSION FUSION CERVICAL 3- CERVICAL4, CERVICAL 4- CERVICAL 5, CERVICAL, CERVICAL 5- CERVICAL 6 WITH INSTRUMENTATION AND ALLOGRAFT (N/A Spine Cervical)  Patient Location: PACU  Anesthesia Type:General  Level of Consciousness: awake and patient cooperative  Airway & Oxygen Therapy: Patient Spontanous Breathing  Post-op Assessment: Report given to RN and Post -op Vital signs reviewed and stable  Post vital signs: Reviewed and stable  Last Vitals:  Vitals Value Taken Time  BP 163/82 01/05/21 1634  Temp    Pulse 104 01/05/21 1635  Resp 24 01/05/21 1635  SpO2 96 % 01/05/21 1635  Vitals shown include unvalidated device data.  Last Pain:  Vitals:   01/05/21 1102  TempSrc:   PainSc: 3       Patients Stated Pain Goal: 2 (01/05/21 1102)  Complications: No complications documented.

## 2021-01-05 NOTE — Anesthesia Postprocedure Evaluation (Signed)
Anesthesia Post Note  Patient: [name]  Procedure(s) Performed: ANTERIOR CERVICAL DECOMPRESSION FUSION CERVICAL 3- CERVICAL4, CERVICAL 4- CERVICAL 5, CERVICAL, CERVICAL 5- CERVICAL 6 WITH INSTRUMENTATION AND ALLOGRAFT (N/A Spine Cervical)     Patient location during evaluation: PACU Anesthesia Type: General Level of consciousness: awake and alert Pain management: pain level controlled Vital Signs Assessment: post-procedure vital signs reviewed and stable Respiratory status: spontaneous breathing, nonlabored ventilation, respiratory function stable and patient connected to nasal cannula oxygen Cardiovascular status: blood pressure returned to baseline and stable Postop Assessment: no apparent nausea or vomiting Anesthetic complications: no   No complications documented.  Last Vitals:  Vitals:   01/05/21 1715 01/05/21 1730  BP: (!) 141/70 (!) 103/91  Pulse: 85 85  Resp: 14 14  Temp:  36.4 C  SpO2: 95% 97%    Last Pain:  Vitals:   01/05/21 1730  TempSrc:   PainSc: 4     LLE Motor Response: Purposeful movement (01/05/21 1730) LLE Sensation: Full sensation (01/05/21 1730) RLE Motor Response: Purposeful movement (01/05/21 1730) RLE Sensation: Full sensation (01/05/21 1730)      Ramondo Dietze,W. EDMOND

## 2021-01-06 ENCOUNTER — Encounter (HOSPITAL_COMMUNITY): Payer: Self-pay | Admitting: Orthopedic Surgery

## 2021-01-06 DIAGNOSIS — I10 Essential (primary) hypertension: Secondary | ICD-10-CM | POA: Diagnosis not present

## 2021-01-06 DIAGNOSIS — E119 Type 2 diabetes mellitus without complications: Secondary | ICD-10-CM | POA: Diagnosis not present

## 2021-01-06 DIAGNOSIS — M4802 Spinal stenosis, cervical region: Secondary | ICD-10-CM | POA: Diagnosis not present

## 2021-01-06 DIAGNOSIS — M5412 Radiculopathy, cervical region: Secondary | ICD-10-CM | POA: Diagnosis not present

## 2021-01-06 DIAGNOSIS — Z96642 Presence of left artificial hip joint: Secondary | ICD-10-CM | POA: Diagnosis not present

## 2021-01-06 DIAGNOSIS — Z79899 Other long term (current) drug therapy: Secondary | ICD-10-CM | POA: Diagnosis not present

## 2021-01-06 DIAGNOSIS — Z7984 Long term (current) use of oral hypoglycemic drugs: Secondary | ICD-10-CM | POA: Diagnosis not present

## 2021-01-06 LAB — GLUCOSE, CAPILLARY: Glucose-Capillary: 137 mg/dL — ABNORMAL HIGH (ref 70–99)

## 2021-01-06 MED ORDER — HYDROCODONE-ACETAMINOPHEN 10-325 MG PO TABS: 1.0000 | ORAL_TABLET | ORAL | 0 refills | Status: DC | PRN

## 2021-01-06 NOTE — Progress Notes (Signed)
    Patient doing well  Denies arm pain Tolerating PO well   Physical Exam: Vitals:   01/05/21 2302 01/06/21 0327  BP: 136/72 130/72  Pulse: 68 73  Resp: 20 18  Temp: 97.8 F (36.6 C) 98.2 F (36.8 C)  SpO2: 97% 96%    Neck soft/supple Dressing in place NVI  POD #1 s/p ACDF, doing well  - encourage ambulation - Norco for pain, Robaxin for muscle spasms - d/c home today with f/u in 2 weeks

## 2021-01-06 NOTE — Progress Notes (Signed)
Patient is discharged from 3C10 at this time. Alert and in stable condition. IV site d/c'd and instructions read to patient and spouse with understanding verbalized and all questions answered. Left unit via wheelchair with all belongings at side.

## 2021-01-07 DIAGNOSIS — M47816 Spondylosis without myelopathy or radiculopathy, lumbar region: Secondary | ICD-10-CM | POA: Diagnosis not present

## 2021-01-07 DIAGNOSIS — G894 Chronic pain syndrome: Secondary | ICD-10-CM | POA: Diagnosis not present

## 2021-01-07 DIAGNOSIS — Z7984 Long term (current) use of oral hypoglycemic drugs: Secondary | ICD-10-CM | POA: Diagnosis not present

## 2021-01-07 DIAGNOSIS — Z4789 Encounter for other orthopedic aftercare: Secondary | ICD-10-CM | POA: Diagnosis not present

## 2021-01-07 DIAGNOSIS — Z794 Long term (current) use of insulin: Secondary | ICD-10-CM | POA: Diagnosis not present

## 2021-01-07 DIAGNOSIS — M47812 Spondylosis without myelopathy or radiculopathy, cervical region: Secondary | ICD-10-CM | POA: Diagnosis not present

## 2021-01-07 DIAGNOSIS — E119 Type 2 diabetes mellitus without complications: Secondary | ICD-10-CM | POA: Diagnosis not present

## 2021-01-07 DIAGNOSIS — R2681 Unsteadiness on feet: Secondary | ICD-10-CM | POA: Diagnosis not present

## 2021-01-10 DIAGNOSIS — Z981 Arthrodesis status: Secondary | ICD-10-CM | POA: Diagnosis not present

## 2021-01-11 DIAGNOSIS — Z7984 Long term (current) use of oral hypoglycemic drugs: Secondary | ICD-10-CM | POA: Diagnosis not present

## 2021-01-11 DIAGNOSIS — M47816 Spondylosis without myelopathy or radiculopathy, lumbar region: Secondary | ICD-10-CM | POA: Diagnosis not present

## 2021-01-11 DIAGNOSIS — Z4789 Encounter for other orthopedic aftercare: Secondary | ICD-10-CM | POA: Diagnosis not present

## 2021-01-11 DIAGNOSIS — G894 Chronic pain syndrome: Secondary | ICD-10-CM | POA: Diagnosis not present

## 2021-01-11 DIAGNOSIS — E119 Type 2 diabetes mellitus without complications: Secondary | ICD-10-CM | POA: Diagnosis not present

## 2021-01-11 DIAGNOSIS — R2681 Unsteadiness on feet: Secondary | ICD-10-CM | POA: Diagnosis not present

## 2021-01-11 DIAGNOSIS — M47812 Spondylosis without myelopathy or radiculopathy, cervical region: Secondary | ICD-10-CM | POA: Diagnosis not present

## 2021-01-11 DIAGNOSIS — Z794 Long term (current) use of insulin: Secondary | ICD-10-CM | POA: Diagnosis not present

## 2021-01-13 DIAGNOSIS — Z4789 Encounter for other orthopedic aftercare: Secondary | ICD-10-CM | POA: Diagnosis not present

## 2021-01-13 DIAGNOSIS — E119 Type 2 diabetes mellitus without complications: Secondary | ICD-10-CM | POA: Diagnosis not present

## 2021-01-13 DIAGNOSIS — M47812 Spondylosis without myelopathy or radiculopathy, cervical region: Secondary | ICD-10-CM | POA: Diagnosis not present

## 2021-01-13 DIAGNOSIS — Z7984 Long term (current) use of oral hypoglycemic drugs: Secondary | ICD-10-CM | POA: Diagnosis not present

## 2021-01-13 DIAGNOSIS — R2681 Unsteadiness on feet: Secondary | ICD-10-CM | POA: Diagnosis not present

## 2021-01-13 DIAGNOSIS — G894 Chronic pain syndrome: Secondary | ICD-10-CM | POA: Diagnosis not present

## 2021-01-13 DIAGNOSIS — M47816 Spondylosis without myelopathy or radiculopathy, lumbar region: Secondary | ICD-10-CM | POA: Diagnosis not present

## 2021-01-13 DIAGNOSIS — Z794 Long term (current) use of insulin: Secondary | ICD-10-CM | POA: Diagnosis not present

## 2021-01-16 DIAGNOSIS — M47816 Spondylosis without myelopathy or radiculopathy, lumbar region: Secondary | ICD-10-CM | POA: Diagnosis not present

## 2021-01-16 DIAGNOSIS — G894 Chronic pain syndrome: Secondary | ICD-10-CM | POA: Diagnosis not present

## 2021-01-16 DIAGNOSIS — Z4789 Encounter for other orthopedic aftercare: Secondary | ICD-10-CM | POA: Diagnosis not present

## 2021-01-16 DIAGNOSIS — R2681 Unsteadiness on feet: Secondary | ICD-10-CM | POA: Diagnosis not present

## 2021-01-16 DIAGNOSIS — Z7984 Long term (current) use of oral hypoglycemic drugs: Secondary | ICD-10-CM | POA: Diagnosis not present

## 2021-01-16 DIAGNOSIS — Z794 Long term (current) use of insulin: Secondary | ICD-10-CM | POA: Diagnosis not present

## 2021-01-16 DIAGNOSIS — E119 Type 2 diabetes mellitus without complications: Secondary | ICD-10-CM | POA: Diagnosis not present

## 2021-01-16 DIAGNOSIS — M47812 Spondylosis without myelopathy or radiculopathy, cervical region: Secondary | ICD-10-CM | POA: Diagnosis not present

## 2021-01-18 DIAGNOSIS — E1129 Type 2 diabetes mellitus with other diabetic kidney complication: Secondary | ICD-10-CM | POA: Diagnosis not present

## 2021-01-18 DIAGNOSIS — Z9889 Other specified postprocedural states: Secondary | ICD-10-CM | POA: Diagnosis not present

## 2021-01-18 NOTE — Discharge Summary (Signed)
Patient ID: Steve Mccormick MRN: [id number] DOB/AGE: 60-Jan-1962 60 y.o.  Admit date: 01/05/2021 Discharge date: 01/06/2021  Admission Diagnoses:  Active Problems:   Myeloradiculopathy   Discharge Diagnoses:  Same  Past Medical History:  Diagnosis Date  . Diabetes mellitus   . Fatty liver   . GERD (gastroesophageal reflux disease)   . Hypertension   . Psoriatic arthritis (HCC)    psoriatic and RA  . Tinnitus   . Wears hearing aid     Surgeries: Procedure(s): ANTERIOR CERVICAL DECOMPRESSION FUSION CERVICAL 3- CERVICAL4, CERVICAL 4- CERVICAL 5, CERVICAL, CERVICAL 5- CERVICAL 6 WITH INSTRUMENTATION AND ALLOGRAFT on 01/05/2021   Consultants: None  Discharged Condition: Improved  Hospital Course: Steve Mccormick is an 60 y.o. male who was admitted 01/05/2021 for operative treatment of radiculopathy. Patient has severe unremitting pain that affects sleep, daily activities, and work/hobbies. After pre-op clearance the patient was taken to the operating room on 01/05/2021 and underwent  Procedure(s): ANTERIOR CERVICAL DECOMPRESSION FUSION CERVICAL 3- CERVICAL4, CERVICAL 4- CERVICAL 5, CERVICAL, CERVICAL 5- CERVICAL 6 WITH INSTRUMENTATION AND ALLOGRAFT.    Patient was given perioperative antibiotics:  Anti-infectives (From admission, onward)   Start     Dose/Rate Route Frequency Ordered Stop   01/05/21 2200  ceFAZolin (ANCEF) IVPB 2g/100 mL premix        2 g 200 mL/hr over 30 Minutes Intravenous Every 8 hours 01/05/21 1822 01/06/21 0618   01/05/21 1030  ceFAZolin (ANCEF) IVPB 2g/100 mL premix        2 g 200 mL/hr over 30 Minutes Intravenous On call to O.R. 01/05/21 1028 01/05/21 1326       Patient was given sequential compression devices, early ambulation to prevent DVT.  Patient benefited maximally from hospital stay and there were no complications.    Recent vital signs: BP 130/72 (BP Location: Left Arm)   Pulse 73   Temp 98.2 F (36.8 C) (Oral)   Resp 18   Ht   (1.803 m)   Wt 106.6 kg   SpO2 96%   BMI 32.78 kg/m    Discharge Medications:   Allergies as of 01/06/2021      Reactions   Fish Allergy Anaphylaxis, Swelling, Other (See Comments)   Shaky    Percocet [oxycodone-acetaminophen] Itching      Medication List    TAKE these medications   canagliflozin 300 MG Tabs tablet Commonly known as: INVOKANA Take 300 mg by mouth daily before breakfast.   cetirizine 10 MG tablet Commonly known as: ZYRTEC Take 10 mg by mouth daily. Allergies   diclofenac Sodium 1 % Gel Commonly known as: VOLTAREN Apply 1 application topically 4 (four) times daily as needed (hand pain).   dicyclomine 10 MG capsule Commonly known as: BENTYL TAKE 1 CAPSULE BY MOUTH UP TO 4 TIMES A DAY FOR CRAMPS/DIARRHEA (HOLD FOR CONSTIPATION) What changed:   how much to take  how to take this  when to take this  reasons to take this  additional instructions   empagliflozin 25 MG Tabs tablet Commonly known as: JARDIANCE Take 25 mg by mouth daily.   gabapentin 400 MG capsule Commonly known as: NEURONTIN Take 400 mg by mouth 4 (four) times daily.   glipiZIDE 10 MG 24 hr tablet Commonly known as: GLUCOTROL XL Take 10 mg by mouth daily.   HYDROcodone-acetaminophen 10-325 MG tablet Commonly known as: NORCO Take 1 tablet by mouth every 4 (four) hours as needed for moderate pain ((score 4 to  6)). What changed: You were already taking a medication with the same name, and this prescription was added. Make sure you understand how and when to take each.   HYDROcodone-acetaminophen 10-325 MG tablet Commonly known as: NORCO Take 1 tablet by mouth 4 (four) times daily. What changed: Another medication with the same name was added. Make sure you understand how and when to take each.   leflunomide 20 MG tablet Commonly known as: ARAVA Take 20 mg by mouth daily.   lisinopril 10 MG tablet Commonly known as: ZESTRIL Take 10 mg by mouth daily.   metFORMIN 1000  MG tablet Commonly known as: GLUCOPHAGE Take 1,000 mg by mouth 2 (two) times daily.   methocarbamol 750 MG tablet Commonly known as: ROBAXIN Take 1,125 mg by mouth 4 (four) times daily as needed for muscle spasms.   multivitamin with minerals Tabs tablet Take 1 tablet by mouth every evening.   ondansetron 4 MG tablet Commonly known as: Zofran Take 1 tablet (4 mg total) by mouth every 8 (eight) hours as needed for nausea or vomiting.   pantoprazole 40 MG tablet Commonly known as: PROTONIX TAKE 1 TABLET BY MOUTH  DAILY BEFORE BREAKFAST What changed:   how much to take  how to take this  when to take this  additional instructions   pioglitazone 30 MG tablet Commonly known as: ACTOS Take 30 mg by mouth daily.   REMICADE IV Inject 1 Dose into the vein every 6 (six) weeks.   tamsulosin 0.4 MG Caps capsule Commonly known as: FLOMAX Take 0.4 mg by mouth at bedtime.   Victoza 18 MG/3ML Sopn Generic drug: liraglutide Inject 1.8 mg into the skin every evening.       Diagnostic Studies: DG Cervical Spine 2 or 3 views  Result Date: 01/05/2021 CLINICAL DATA:  ACDF C3-C4 C4-C5, C5-C6 EXAM: DG C-ARM 1-60 MIN; CERVICAL SPINE - 2-3 VIEW FLUOROSCOPY TIME:  Fluoroscopy Time:  12 seconds Radiation Exposure Index (if provided by the fluoroscopic device): 2.7 mGy Number of Acquired Spot Images: 2 COMPARISON:  None. FINDINGS: Two C-arm fluoroscopic images were obtained intraoperatively and submitted for post operative interpretation. The initial image demonstrates surgical probe with the tip projecting at the C5-C6 interspace anteriorly. Second image demonstrates C3 through C6 ACDF with anterior plate and screws and intervening spacers. Please see the performing provider's procedural report for further detail. IMPRESSION: C3-C6 ACDF. Electronically Signed   By: Feliberto Harts MD   On: 01/05/2021 16:39   DG C-Arm 1-60 Min  Result Date: 01/05/2021 CLINICAL DATA:  ACDF C3-C4 C4-C5, C5-C6  EXAM: DG C-ARM 1-60 MIN; CERVICAL SPINE - 2-3 VIEW FLUOROSCOPY TIME:  Fluoroscopy Time:  12 seconds Radiation Exposure Index (if provided by the fluoroscopic device): 2.7 mGy Number of Acquired Spot Images: 2 COMPARISON:  None. FINDINGS: Two C-arm fluoroscopic images were obtained intraoperatively and submitted for post operative interpretation. The initial image demonstrates surgical probe with the tip projecting at the C5-C6 interspace anteriorly. Second image demonstrates C3 through C6 ACDF with anterior plate and screws and intervening spacers. Please see the performing provider's procedural report for further detail. IMPRESSION: C3-C6 ACDF. Electronically Signed   By: Feliberto Harts MD   On: 01/05/2021 16:39    Disposition: Discharge disposition: 01-Home or Self Care        POD #1 s/p ACDF, doing well  - encourage ambulation - Norco for pain, Robaxin for muscle spasms -Scripts for pain sent to pharmacy electronically  -D/C instructions sheet printed and  in chart -D/C today  -F/U in office 2 weeks   Signed: Georga Bora 01/18/2021, 11:44 AM

## 2021-01-19 DIAGNOSIS — Z794 Long term (current) use of insulin: Secondary | ICD-10-CM | POA: Diagnosis not present

## 2021-01-19 DIAGNOSIS — M47816 Spondylosis without myelopathy or radiculopathy, lumbar region: Secondary | ICD-10-CM | POA: Diagnosis not present

## 2021-01-19 DIAGNOSIS — E119 Type 2 diabetes mellitus without complications: Secondary | ICD-10-CM | POA: Diagnosis not present

## 2021-01-19 DIAGNOSIS — Z7984 Long term (current) use of oral hypoglycemic drugs: Secondary | ICD-10-CM | POA: Diagnosis not present

## 2021-01-19 DIAGNOSIS — Z4789 Encounter for other orthopedic aftercare: Secondary | ICD-10-CM | POA: Diagnosis not present

## 2021-01-19 DIAGNOSIS — R2681 Unsteadiness on feet: Secondary | ICD-10-CM | POA: Diagnosis not present

## 2021-01-19 DIAGNOSIS — M47812 Spondylosis without myelopathy or radiculopathy, cervical region: Secondary | ICD-10-CM | POA: Diagnosis not present

## 2021-01-19 DIAGNOSIS — G894 Chronic pain syndrome: Secondary | ICD-10-CM | POA: Diagnosis not present

## 2021-01-23 DIAGNOSIS — R2681 Unsteadiness on feet: Secondary | ICD-10-CM | POA: Diagnosis not present

## 2021-01-23 DIAGNOSIS — Z794 Long term (current) use of insulin: Secondary | ICD-10-CM | POA: Diagnosis not present

## 2021-01-23 DIAGNOSIS — M47816 Spondylosis without myelopathy or radiculopathy, lumbar region: Secondary | ICD-10-CM | POA: Diagnosis not present

## 2021-01-23 DIAGNOSIS — E119 Type 2 diabetes mellitus without complications: Secondary | ICD-10-CM | POA: Diagnosis not present

## 2021-01-23 DIAGNOSIS — M47812 Spondylosis without myelopathy or radiculopathy, cervical region: Secondary | ICD-10-CM | POA: Diagnosis not present

## 2021-01-23 DIAGNOSIS — Z7984 Long term (current) use of oral hypoglycemic drugs: Secondary | ICD-10-CM | POA: Diagnosis not present

## 2021-01-23 DIAGNOSIS — Z4789 Encounter for other orthopedic aftercare: Secondary | ICD-10-CM | POA: Diagnosis not present

## 2021-01-23 DIAGNOSIS — G894 Chronic pain syndrome: Secondary | ICD-10-CM | POA: Diagnosis not present

## 2021-01-25 DIAGNOSIS — I10 Essential (primary) hypertension: Secondary | ICD-10-CM | POA: Diagnosis not present

## 2021-01-25 DIAGNOSIS — E1122 Type 2 diabetes mellitus with diabetic chronic kidney disease: Secondary | ICD-10-CM | POA: Diagnosis not present

## 2021-01-25 DIAGNOSIS — G720 Drug-induced myopathy: Secondary | ICD-10-CM | POA: Diagnosis not present

## 2021-01-25 DIAGNOSIS — R7309 Other abnormal glucose: Secondary | ICD-10-CM | POA: Diagnosis not present

## 2021-01-30 DIAGNOSIS — M456 Ankylosing spondylitis lumbar region: Secondary | ICD-10-CM | POA: Diagnosis not present

## 2021-01-30 DIAGNOSIS — L4050 Arthropathic psoriasis, unspecified: Secondary | ICD-10-CM | POA: Diagnosis not present

## 2021-02-02 DIAGNOSIS — G894 Chronic pain syndrome: Secondary | ICD-10-CM | POA: Diagnosis not present

## 2021-02-02 DIAGNOSIS — M47816 Spondylosis without myelopathy or radiculopathy, lumbar region: Secondary | ICD-10-CM | POA: Diagnosis not present

## 2021-02-02 DIAGNOSIS — M47812 Spondylosis without myelopathy or radiculopathy, cervical region: Secondary | ICD-10-CM | POA: Diagnosis not present

## 2021-02-02 DIAGNOSIS — L4050 Arthropathic psoriasis, unspecified: Secondary | ICD-10-CM | POA: Diagnosis not present

## 2021-02-14 ENCOUNTER — Ambulatory Visit: Payer: Medicare Other | Admitting: Internal Medicine

## 2021-02-21 DIAGNOSIS — Z9889 Other specified postprocedural states: Secondary | ICD-10-CM | POA: Diagnosis not present

## 2021-03-14 ENCOUNTER — Other Ambulatory Visit: Payer: Self-pay | Admitting: Gastroenterology

## 2021-03-20 DIAGNOSIS — Z79899 Other long term (current) drug therapy: Secondary | ICD-10-CM | POA: Diagnosis not present

## 2021-03-20 DIAGNOSIS — M456 Ankylosing spondylitis lumbar region: Secondary | ICD-10-CM | POA: Diagnosis not present

## 2021-03-20 DIAGNOSIS — L4050 Arthropathic psoriasis, unspecified: Secondary | ICD-10-CM | POA: Diagnosis not present

## 2021-03-30 DIAGNOSIS — M47812 Spondylosis without myelopathy or radiculopathy, cervical region: Secondary | ICD-10-CM | POA: Diagnosis not present

## 2021-03-30 DIAGNOSIS — M47816 Spondylosis without myelopathy or radiculopathy, lumbar region: Secondary | ICD-10-CM | POA: Diagnosis not present

## 2021-03-30 DIAGNOSIS — Z79891 Long term (current) use of opiate analgesic: Secondary | ICD-10-CM | POA: Diagnosis not present

## 2021-03-30 DIAGNOSIS — L4050 Arthropathic psoriasis, unspecified: Secondary | ICD-10-CM | POA: Diagnosis not present

## 2021-03-30 DIAGNOSIS — G894 Chronic pain syndrome: Secondary | ICD-10-CM | POA: Diagnosis not present

## 2021-04-04 DIAGNOSIS — Z9889 Other specified postprocedural states: Secondary | ICD-10-CM | POA: Diagnosis not present

## 2021-04-26 DIAGNOSIS — E1129 Type 2 diabetes mellitus with other diabetic kidney complication: Secondary | ICD-10-CM | POA: Diagnosis not present

## 2021-05-01 DIAGNOSIS — L4050 Arthropathic psoriasis, unspecified: Secondary | ICD-10-CM | POA: Diagnosis not present

## 2021-05-01 DIAGNOSIS — M456 Ankylosing spondylitis lumbar region: Secondary | ICD-10-CM | POA: Diagnosis not present

## 2021-05-03 DIAGNOSIS — L4052 Psoriatic arthritis mutilans: Secondary | ICD-10-CM | POA: Diagnosis not present

## 2021-05-03 DIAGNOSIS — E1122 Type 2 diabetes mellitus with diabetic chronic kidney disease: Secondary | ICD-10-CM | POA: Diagnosis not present

## 2021-05-10 DIAGNOSIS — M47812 Spondylosis without myelopathy or radiculopathy, cervical region: Secondary | ICD-10-CM | POA: Diagnosis not present

## 2021-05-10 DIAGNOSIS — L4050 Arthropathic psoriasis, unspecified: Secondary | ICD-10-CM | POA: Diagnosis not present

## 2021-05-10 DIAGNOSIS — G894 Chronic pain syndrome: Secondary | ICD-10-CM | POA: Diagnosis not present

## 2021-05-10 DIAGNOSIS — M47816 Spondylosis without myelopathy or radiculopathy, lumbar region: Secondary | ICD-10-CM | POA: Diagnosis not present

## 2021-06-14 DIAGNOSIS — M456 Ankylosing spondylitis lumbar region: Secondary | ICD-10-CM | POA: Diagnosis not present

## 2021-06-14 DIAGNOSIS — L4050 Arthropathic psoriasis, unspecified: Secondary | ICD-10-CM | POA: Diagnosis not present

## 2021-06-14 DIAGNOSIS — Z79899 Other long term (current) drug therapy: Secondary | ICD-10-CM | POA: Diagnosis not present

## 2021-07-04 DIAGNOSIS — L409 Psoriasis, unspecified: Secondary | ICD-10-CM | POA: Diagnosis not present

## 2021-07-04 DIAGNOSIS — Z79899 Other long term (current) drug therapy: Secondary | ICD-10-CM | POA: Diagnosis not present

## 2021-07-04 DIAGNOSIS — L4050 Arthropathic psoriasis, unspecified: Secondary | ICD-10-CM | POA: Diagnosis not present

## 2021-07-04 DIAGNOSIS — M150 Primary generalized (osteo)arthritis: Secondary | ICD-10-CM | POA: Diagnosis not present

## 2021-07-04 DIAGNOSIS — M456 Ankylosing spondylitis lumbar region: Secondary | ICD-10-CM | POA: Diagnosis not present

## 2021-07-25 DIAGNOSIS — M47812 Spondylosis without myelopathy or radiculopathy, cervical region: Secondary | ICD-10-CM | POA: Diagnosis not present

## 2021-07-25 DIAGNOSIS — M47816 Spondylosis without myelopathy or radiculopathy, lumbar region: Secondary | ICD-10-CM | POA: Diagnosis not present

## 2021-07-25 DIAGNOSIS — G894 Chronic pain syndrome: Secondary | ICD-10-CM | POA: Diagnosis not present

## 2021-07-25 DIAGNOSIS — L4050 Arthropathic psoriasis, unspecified: Secondary | ICD-10-CM | POA: Diagnosis not present

## 2021-07-26 DIAGNOSIS — M456 Ankylosing spondylitis lumbar region: Secondary | ICD-10-CM | POA: Diagnosis not present

## 2021-09-06 DIAGNOSIS — M456 Ankylosing spondylitis lumbar region: Secondary | ICD-10-CM | POA: Diagnosis not present

## 2021-09-06 DIAGNOSIS — Z79899 Other long term (current) drug therapy: Secondary | ICD-10-CM | POA: Diagnosis not present

## 2021-09-06 DIAGNOSIS — L4050 Arthropathic psoriasis, unspecified: Secondary | ICD-10-CM | POA: Diagnosis not present

## 2021-10-19 DIAGNOSIS — Z111 Encounter for screening for respiratory tuberculosis: Secondary | ICD-10-CM | POA: Diagnosis not present

## 2021-10-19 DIAGNOSIS — R5383 Other fatigue: Secondary | ICD-10-CM | POA: Diagnosis not present

## 2021-10-19 DIAGNOSIS — L4050 Arthropathic psoriasis, unspecified: Secondary | ICD-10-CM | POA: Diagnosis not present

## 2021-10-19 DIAGNOSIS — M456 Ankylosing spondylitis lumbar region: Secondary | ICD-10-CM | POA: Diagnosis not present

## 2021-10-19 DIAGNOSIS — Z79899 Other long term (current) drug therapy: Secondary | ICD-10-CM | POA: Diagnosis not present

## 2021-11-07 DIAGNOSIS — K219 Gastro-esophageal reflux disease without esophagitis: Secondary | ICD-10-CM | POA: Diagnosis not present

## 2021-11-07 DIAGNOSIS — I10 Essential (primary) hypertension: Secondary | ICD-10-CM | POA: Diagnosis not present

## 2021-11-07 DIAGNOSIS — E119 Type 2 diabetes mellitus without complications: Secondary | ICD-10-CM | POA: Diagnosis not present

## 2021-11-13 DIAGNOSIS — Z79899 Other long term (current) drug therapy: Secondary | ICD-10-CM | POA: Diagnosis not present

## 2021-11-13 DIAGNOSIS — E1129 Type 2 diabetes mellitus with other diabetic kidney complication: Secondary | ICD-10-CM | POA: Diagnosis not present

## 2021-11-13 DIAGNOSIS — L4052 Psoriatic arthritis mutilans: Secondary | ICD-10-CM | POA: Diagnosis not present

## 2021-11-13 DIAGNOSIS — K219 Gastro-esophageal reflux disease without esophagitis: Secondary | ICD-10-CM | POA: Diagnosis not present

## 2021-11-20 DIAGNOSIS — E119 Type 2 diabetes mellitus without complications: Secondary | ICD-10-CM | POA: Diagnosis not present

## 2021-11-20 DIAGNOSIS — I10 Essential (primary) hypertension: Secondary | ICD-10-CM | POA: Diagnosis not present

## 2021-11-20 DIAGNOSIS — L4052 Psoriatic arthritis mutilans: Secondary | ICD-10-CM | POA: Diagnosis not present

## 2021-11-20 DIAGNOSIS — R000 Tachycardia, unspecified: Secondary | ICD-10-CM | POA: Diagnosis not present

## 2021-11-20 DIAGNOSIS — G720 Drug-induced myopathy: Secondary | ICD-10-CM | POA: Diagnosis not present

## 2021-12-04 DIAGNOSIS — M456 Ankylosing spondylitis lumbar region: Secondary | ICD-10-CM | POA: Diagnosis not present

## 2021-12-04 DIAGNOSIS — L4050 Arthropathic psoriasis, unspecified: Secondary | ICD-10-CM | POA: Diagnosis not present

## 2022-01-02 DIAGNOSIS — Z79899 Other long term (current) drug therapy: Secondary | ICD-10-CM | POA: Diagnosis not present

## 2022-01-02 DIAGNOSIS — L409 Psoriasis, unspecified: Secondary | ICD-10-CM | POA: Diagnosis not present

## 2022-01-02 DIAGNOSIS — M456 Ankylosing spondylitis lumbar region: Secondary | ICD-10-CM | POA: Diagnosis not present

## 2022-01-02 DIAGNOSIS — M1991 Primary osteoarthritis, unspecified site: Secondary | ICD-10-CM | POA: Diagnosis not present

## 2022-01-02 DIAGNOSIS — Z1589 Genetic susceptibility to other disease: Secondary | ICD-10-CM | POA: Diagnosis not present

## 2022-01-15 DIAGNOSIS — L4050 Arthropathic psoriasis, unspecified: Secondary | ICD-10-CM | POA: Diagnosis not present

## 2022-01-15 DIAGNOSIS — Z79899 Other long term (current) drug therapy: Secondary | ICD-10-CM | POA: Diagnosis not present

## 2022-01-22 DIAGNOSIS — Z79891 Long term (current) use of opiate analgesic: Secondary | ICD-10-CM | POA: Diagnosis not present

## 2022-01-22 DIAGNOSIS — M47816 Spondylosis without myelopathy or radiculopathy, lumbar region: Secondary | ICD-10-CM | POA: Diagnosis not present

## 2022-01-22 DIAGNOSIS — M47812 Spondylosis without myelopathy or radiculopathy, cervical region: Secondary | ICD-10-CM | POA: Diagnosis not present

## 2022-01-22 DIAGNOSIS — G894 Chronic pain syndrome: Secondary | ICD-10-CM | POA: Diagnosis not present

## 2022-02-07 DIAGNOSIS — K219 Gastro-esophageal reflux disease without esophagitis: Secondary | ICD-10-CM | POA: Diagnosis not present

## 2022-02-07 DIAGNOSIS — E119 Type 2 diabetes mellitus without complications: Secondary | ICD-10-CM | POA: Diagnosis not present

## 2022-02-07 DIAGNOSIS — I10 Essential (primary) hypertension: Secondary | ICD-10-CM | POA: Diagnosis not present

## 2022-02-26 DIAGNOSIS — L4050 Arthropathic psoriasis, unspecified: Secondary | ICD-10-CM | POA: Diagnosis not present

## 2022-02-26 DIAGNOSIS — M456 Ankylosing spondylitis lumbar region: Secondary | ICD-10-CM | POA: Diagnosis not present

## 2022-03-15 DIAGNOSIS — E1129 Type 2 diabetes mellitus with other diabetic kidney complication: Secondary | ICD-10-CM | POA: Diagnosis not present

## 2022-03-22 DIAGNOSIS — E1122 Type 2 diabetes mellitus with diabetic chronic kidney disease: Secondary | ICD-10-CM | POA: Diagnosis not present

## 2022-03-22 DIAGNOSIS — I10 Essential (primary) hypertension: Secondary | ICD-10-CM | POA: Diagnosis not present

## 2022-03-22 DIAGNOSIS — R7309 Other abnormal glucose: Secondary | ICD-10-CM | POA: Diagnosis not present

## 2022-04-19 DIAGNOSIS — L4050 Arthropathic psoriasis, unspecified: Secondary | ICD-10-CM | POA: Diagnosis not present

## 2022-04-19 DIAGNOSIS — Z79899 Other long term (current) drug therapy: Secondary | ICD-10-CM | POA: Diagnosis not present

## 2022-04-19 DIAGNOSIS — M456 Ankylosing spondylitis lumbar region: Secondary | ICD-10-CM | POA: Diagnosis not present

## 2022-05-02 ENCOUNTER — Other Ambulatory Visit: Payer: Self-pay | Admitting: *Deleted

## 2022-05-02 ENCOUNTER — Encounter: Payer: Self-pay | Admitting: *Deleted

## 2022-05-02 NOTE — Patient Instructions (Signed)
Visit Information  Thank you for taking time to visit with me today. Please don't hesitate to contact me if I can be of assistance to you before our next scheduled telephone appointment.  Patient verbalizes understanding of instructions and care plan provided today and agrees to view in MyChart. Active MyChart status and patient understanding of how to access instructions and care plan via MyChart confirmed with patient.     The patient has been provided with contact information for the care management team and has been advised to call with any health related questions or concerns.   Kemper Durie, RN, MSN, [facility] Care Coordinator [phone]

## 2022-05-02 NOTE — Patient Outreach (Signed)
  Care Coordination   Initial Visit Note   05/02/2022 Name: TERAN KNITTLE MRN: [id number] DOB: Feb 03, 1961  DEMITRIS POKORNY is a 61 y.o. year old male who sees Carylon Perches, MD for primary care. I spoke with  Lincoln Brigham by phone today  What matters to the patients health and wellness today?  Report he is doing well, will continue to monitor diet and blood sugars in effort to keep A1C at goal.  Denies need for further education or follow up.    Goals Addressed               This Visit's Progress     COMPLETED: Keep DM controlled (A1C now 6.8) (pt-stated)        Care Coordination Interventions: Provided education to patient about basic DM disease process Reviewed medications with patient and discussed importance of medication adherence Discussed plans with patient for ongoing care management follow up and provided patient with direct contact information for care management team Reviewed scheduled/upcoming provider appointments including: AWV scheduled for Nov Assessed social determinant of health barriers         SDOH assessments and interventions completed:  Yes  SDOH Interventions Today    Flowsheet Row Most Recent Value  SDOH Interventions   Food Insecurity Interventions Intervention Not Indicated  Financial Strain Interventions Intervention Not Indicated  Housing Interventions Intervention Not Indicated  Transportation Interventions Intervention Not Indicated        Care Coordination Interventions Activated:  Yes  Care Coordination Interventions:  Yes, provided   Follow up plan: No further intervention required.   Encounter Outcome:  Pt. Visit Completed   Kemper Durie, RN, MSN, [facility] Care Coordinator [phone]

## 2022-05-10 DIAGNOSIS — I10 Essential (primary) hypertension: Secondary | ICD-10-CM | POA: Diagnosis not present

## 2022-05-10 DIAGNOSIS — E119 Type 2 diabetes mellitus without complications: Secondary | ICD-10-CM | POA: Diagnosis not present

## 2022-05-10 DIAGNOSIS — K219 Gastro-esophageal reflux disease without esophagitis: Secondary | ICD-10-CM | POA: Diagnosis not present

## 2022-05-31 DIAGNOSIS — M456 Ankylosing spondylitis lumbar region: Secondary | ICD-10-CM | POA: Diagnosis not present

## 2022-05-31 DIAGNOSIS — L4050 Arthropathic psoriasis, unspecified: Secondary | ICD-10-CM | POA: Diagnosis not present

## 2022-07-03 DIAGNOSIS — M1991 Primary osteoarthritis, unspecified site: Secondary | ICD-10-CM | POA: Diagnosis not present

## 2022-07-03 DIAGNOSIS — L409 Psoriasis, unspecified: Secondary | ICD-10-CM | POA: Diagnosis not present

## 2022-07-03 DIAGNOSIS — Z79899 Other long term (current) drug therapy: Secondary | ICD-10-CM | POA: Diagnosis not present

## 2022-07-03 DIAGNOSIS — Z1589 Genetic susceptibility to other disease: Secondary | ICD-10-CM | POA: Diagnosis not present

## 2022-07-03 DIAGNOSIS — L4050 Arthropathic psoriasis, unspecified: Secondary | ICD-10-CM | POA: Diagnosis not present

## 2022-07-03 DIAGNOSIS — M456 Ankylosing spondylitis lumbar region: Secondary | ICD-10-CM | POA: Diagnosis not present

## 2022-07-11 IMAGING — MR MR CERVICAL SPINE W/O CM
4 of 5 series · 28 of 48 positions shown · non-contrast
Comparison: Cervical radiographs 10/05/2020. Cervical MRI
10/21/2015

CLINICAL DATA: Neck pain with left shoulder pain.

EXAM:
MRI CERVICAL SPINE WITHOUT CONTRAST
TECHNIQUE: Multiplanar, multisequence MR imaging of the cervical spine was
performed. No intravenous contrast was administered.

[Series 3: T2 · sagittal · 3.0mm · 0.66mm/px · 5 of 12 slices shown (1 of 2)]
[im 1/12]
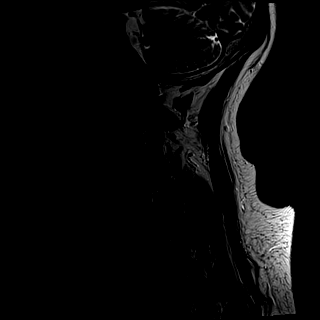
[im 3/12]
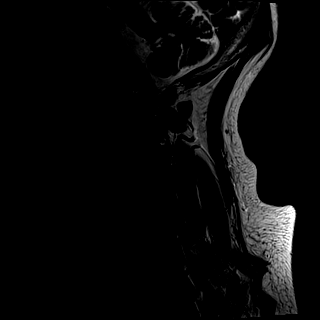
[im 6/12]
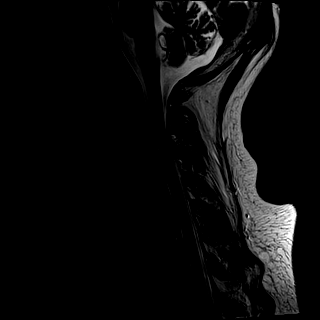
[im 9/12]
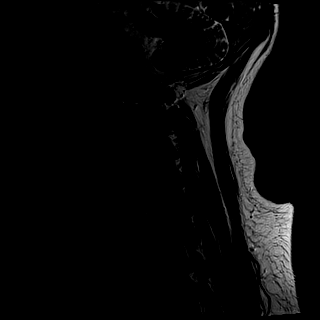
[im 12/12]
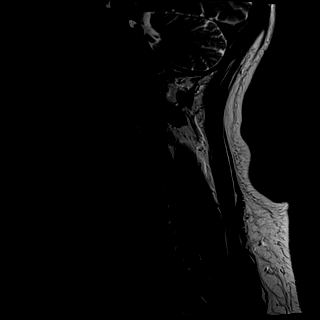

[Series 4: T1 · sagittal · 3.0mm · 0.41mm/px · 6 of 12 slices shown]
[im 1/12]
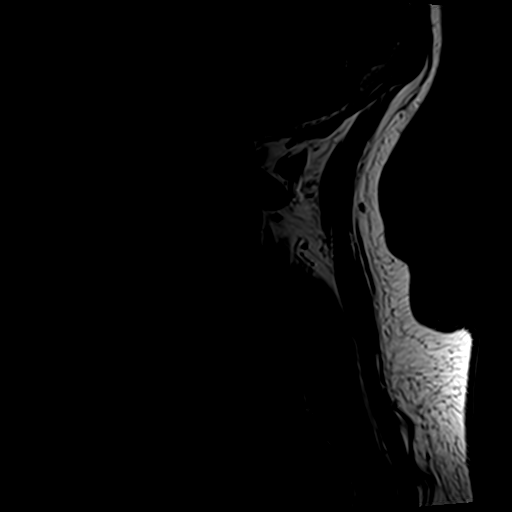
[im 3/12]
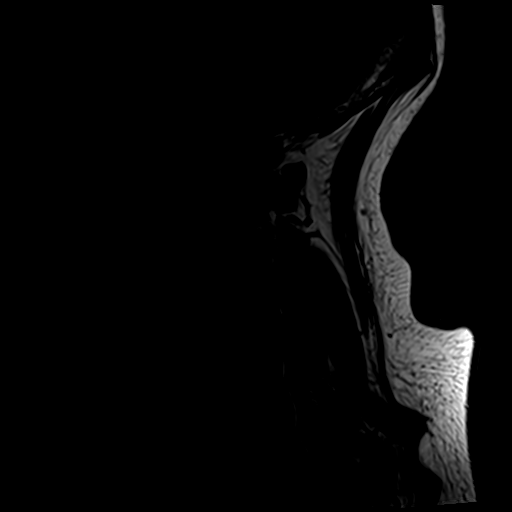
[im 5/12]
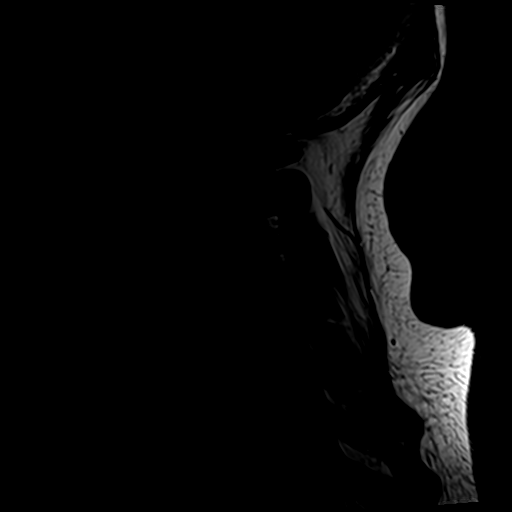
[im 7/12]
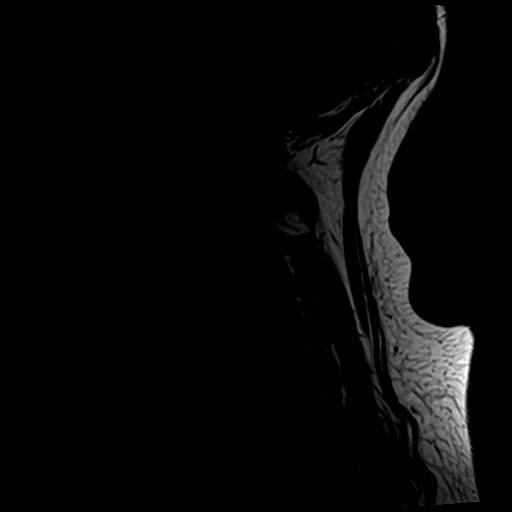
[im 9/12]
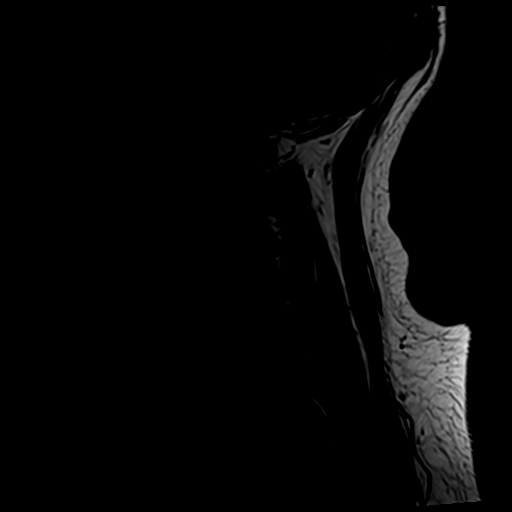
[im 12/12]
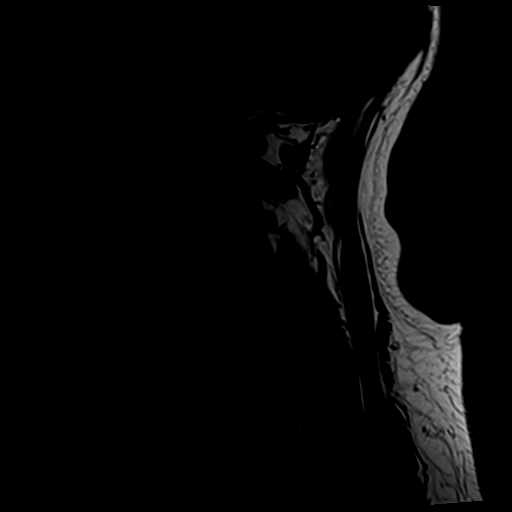

[Series 5: GRE · axial · 3.0mm · 0.35mm/px · z∈[-88,-6]mm · 8 of 32 slices shown]
[im 3/32]
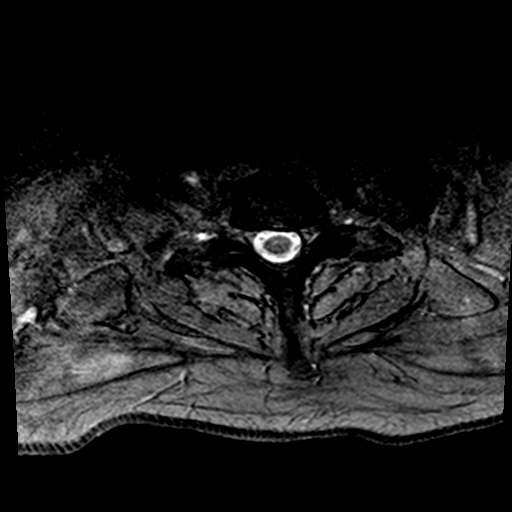
[im 5/32]
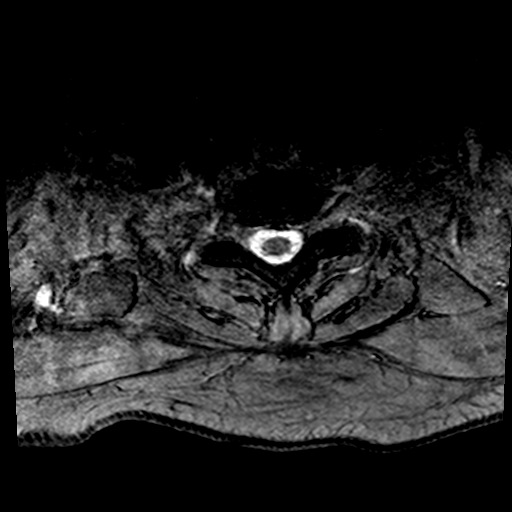
[im 7/32]
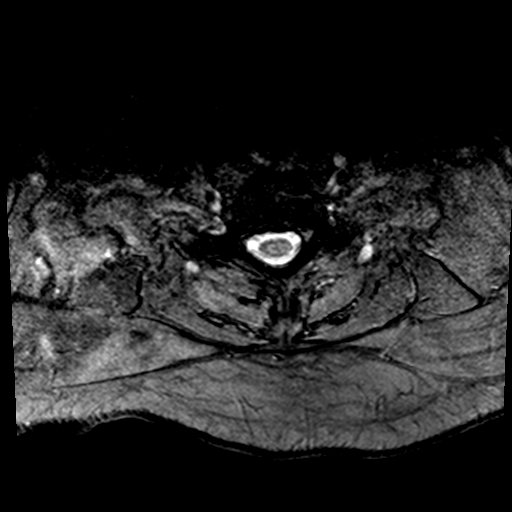
[im 11/32]
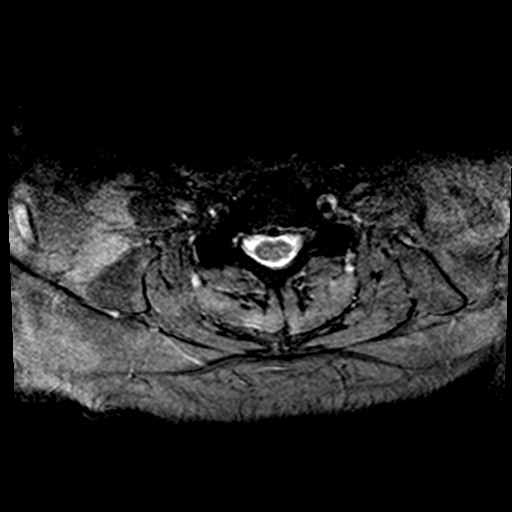
[im 15/32]
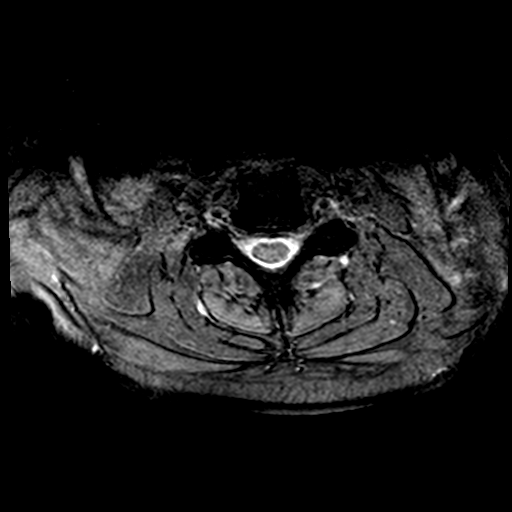
[im 17/32]
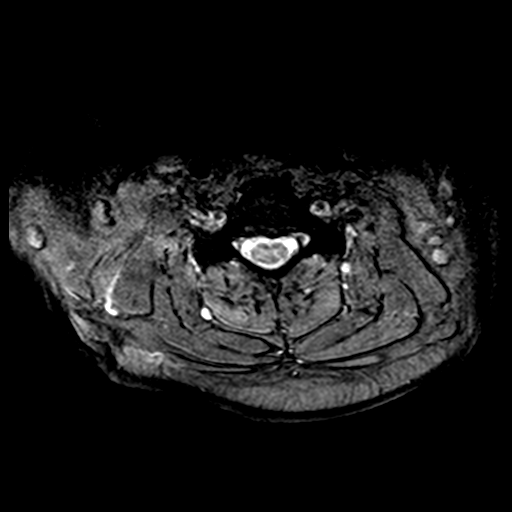
[im 19/32]
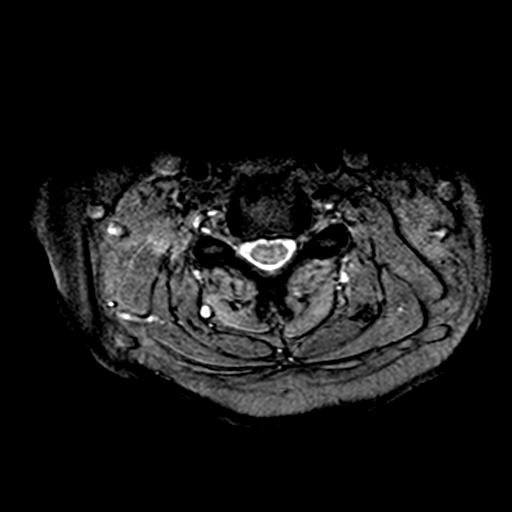
[im 27/32]
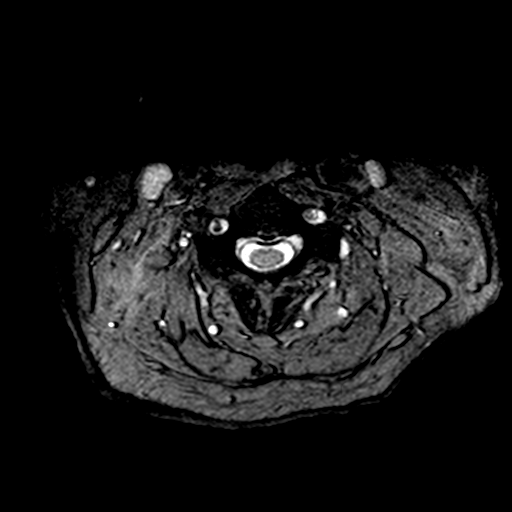

[Series 6: T2 · axial · 3.0mm · 0.70mm/px · z∈[-91,+8]mm · 9 of 30 slices shown (2 of 2)]
[im 1/30]
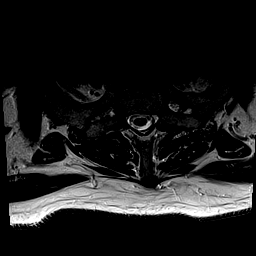
[im 5/30]
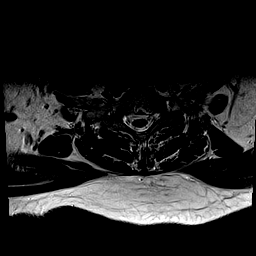
[im 9/30]
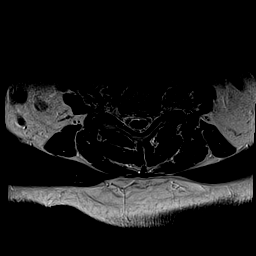
[im 13/30]
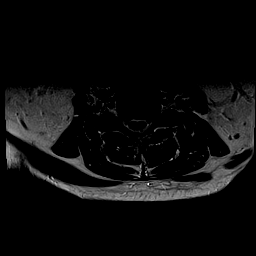
[im 15/30]
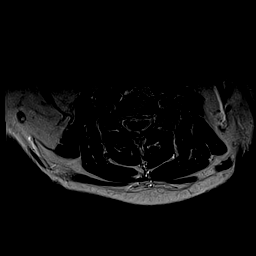
[im 17/30]
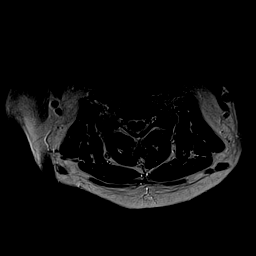
[im 21/30]
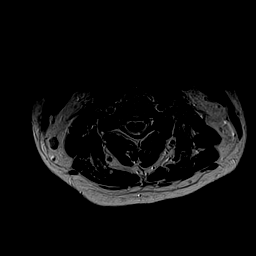
[im 25/30]
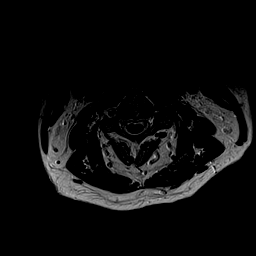
[im 30/30]
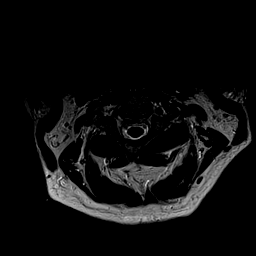

[28 of 48 positions shown; findings below may reference images not displayed]

FINDINGS: Alignment: Normal alignment with straightening of the cervical
lordosis.

Vertebrae: Normal bone marrow.  Negative for fracture or mass.

Cord: Normal signal and morphology

Posterior Fossa, vertebral arteries, paraspinal tissues: Negative

Disc levels:

C2-3: Small central disc protrusion. Negative for stenosis. Mild
facet degeneration.

C3-4: Small central disc protrusion. Mild facet degeneration.
Moderate left foraminal narrowing due to spurring. No interval
change.

C4-5: Minimal central disc protrusion. Mild left foraminal narrowing
due to spurring. No interval change.

C5-6: Disc degeneration with diffuse uncinate spurring. Small
broad-based central disc protrusion. Mild foraminal narrowing on the
right and moderate foraminal narrowing on the left. No interval
change.

C6-7: Shallow central disc protrusion. No significant spinal or
foraminal stenosis.

C7-T1: Negative
IMPRESSION: Multilevel degenerative change throughout the cervical spine.
Bilateral foraminal narrowing due to degenerative change in spurring
as described above.

## 2022-07-17 ENCOUNTER — Ambulatory Visit (INDEPENDENT_AMBULATORY_CARE_PROVIDER_SITE_OTHER): Payer: Medicare Other | Admitting: Internal Medicine

## 2022-07-17 ENCOUNTER — Encounter: Payer: Self-pay | Admitting: Internal Medicine

## 2022-07-17 VITALS — BP 170/83 | HR 84 | Temp 97.5°F | Ht 72.0 in | Wt 256.2 lb

## 2022-07-17 DIAGNOSIS — K219 Gastro-esophageal reflux disease without esophagitis: Secondary | ICD-10-CM | POA: Diagnosis not present

## 2022-07-17 DIAGNOSIS — R198 Other specified symptoms and signs involving the digestive system and abdomen: Secondary | ICD-10-CM | POA: Diagnosis not present

## 2022-07-17 DIAGNOSIS — Z800 Family history of malignant neoplasm of digestive organs: Secondary | ICD-10-CM | POA: Diagnosis not present

## 2022-07-17 NOTE — Progress Notes (Unsigned)
Primary Care Physician:  Carylon Perches, MD Primary Gastroenterologist:  Dr. Jena Gauss  Pre-Procedure History & Physical: HPI:  Steve Mccormick is a 61 y.o. male here for follow-up IBS, GERD fatty liver.  Weight up 5 pounds since his last visit.  GERD well-controlled on Protonix 40 mg daily no dysphagia.  Positive family history of colon cancer; due for colonoscopy 2025.  Patient has alternating constipation and diarrhea about 50-50.  Takes Bentyl on days after he had 4 bowel movements and will go the next day.  Takes Metamucil wafers daily does not take a probiotic any longer.  No other bowel meds such as laxatives, etc.  He has not had any rectal bleeding.  History of fatty liver with mild transaminitis with normal LFTs last year.  Sees Dr. Ouida Sills every 4 months.  He has lab work done regularly.  Pleased to say his prior cervical spine surgery was very successful.  He has a new career as a Games developer.  Past Medical History:  Diagnosis Date   Diabetes mellitus    Fatty liver    GERD (gastroesophageal reflux disease)    Hypertension    Psoriatic arthritis (HCC)    psoriatic and RA   Tinnitus    Wears hearing aid     Past Surgical History:  Procedure Laterality Date   ANKLE SURGERY     bilateral   ANTERIOR CERVICAL DECOMPRESSION/DISCECTOMY FUSION 4 LEVELS N/A 01/05/2021   Procedure: ANTERIOR CERVICAL DECOMPRESSION FUSION CERVICAL 3- CERVICAL4, CERVICAL 4- CERVICAL 5, CERVICAL, CERVICAL 5- CERVICAL 6 WITH INSTRUMENTATION AND ALLOGRAFT;  Surgeon: Estill Bamberg, MD;  Location: [facility] OR;  Service: Orthopedics;  Laterality: N/A;   BIOPSY  08/13/2019   Procedure: BIOPSY;  Surgeon: Corbin Ade, MD;  Location: [facility] END[facility] SUITE;  Service: Endoscopy;;  gastric esophageal   COLONOSCOPY N/A 10/22/2012   Dr. Jena Gauss: colonic diverticulosis. next TCS in 5 years due to [facility], colon polyps   COLONOSCOPY WITH PROPOFOL N/A 08/13/2019   Dr. Jena Gauss: diverticulosis, long redundant colon. due to  personal history of polyps, next TCS in five years   ESOPHAGOGASTRODUODENOSCOPY (EGD) WITH PROPOFOL N/A 08/13/2019   Dr. Jena Gauss: 1cm salmon colored epithelium in distal esophagus (bx benign with no Barrett's), benign gastric polyp.   Ileocolonoscopy  10/02/2007   RMR: Friable anal canal, otherwise normal rectum.  Left-sided diverticula.  The colonic mucosa and  terminal ileum mucosa appeared normal rectum, left-sided diverticula   KNEE SURGERY     bilateral   plantar fasciitis Bilateral    SHOULDER SURGERY     bilateral   TOTAL HIP ARTHROPLASTY Left 09/01/2019   Procedure: TOTAL HIP ARTHROPLASTY ANTERIOR APPROACH;  Surgeon: Sheral Apley, MD;  Location: [facility] ORS;  Service: Orthopedics;  Laterality: Left;    Prior to Admission medications   Medication Sig Start Date End Date Taking? Authorizing Provider  canagliflozin (INVOKANA) 300 MG TABS tablet Take 300 mg by mouth daily before breakfast.   Yes [provider]  celecoxib (CELEBREX) 200 MG capsule Take 200 mg by mouth 2 (two) times daily as needed. 04/26/22  Yes [provider]  cetirizine (ZYRTEC) 10 MG tablet Take 10 mg by mouth daily. Allergies   Yes [provider]  dicyclomine (BENTYL) 10 MG capsule TAKE 1 CAPSULE BY MOUTH UP TO 4 TIMES A DAY FOR CRAMPS/DIARRHEA (HOLD FOR CONSTIPATION) Patient taking differently: Take 10 mg by mouth 4 (four) times daily as needed (Cramps). 09/01/19  Yes Tiffany Kocher, PA-C  empagliflozin (JARDIANCE) 25 MG TABS tablet Take 25 mg by mouth daily.   Yes [provider]  glipiZIDE (GLUCOTROL XL) 10 MG 24 hr tablet Take 10 mg by mouth daily. 06/19/19  Yes [provider]  leflunomide (ARAVA) 20 MG tablet Take 20 mg by mouth daily. 11/14/20  Yes [provider]  lisinopril (ZESTRIL) 10 MG tablet Take 10 mg by mouth daily. 06/23/19  Yes [provider]  metFORMIN (GLUCOPHAGE) 1000 MG tablet Take 1,000 mg by mouth 2 (two) times daily. 06/23/19  Yes  [provider]  Multiple Vitamin (MULTIVITAMIN WITH MINERALS) TABS tablet Take 1 tablet by mouth every evening.   Yes [provider]  ondansetron (ZOFRAN) 4 MG tablet Take 1 tablet (4 mg total) by mouth every 8 (eight) hours as needed for nausea or vomiting. 09/01/19  Yes Albina Billet III, PA-C  pantoprazole (PROTONIX) 40 MG tablet TAKE 1 TABLET BY MOUTH  DAILY BEFORE BREAKFAST 03/14/21  Yes Gelene Mink, NP  pioglitazone (ACTOS) 30 MG tablet Take 30 mg by mouth daily. 11/04/20  Yes [provider]  tamsulosin (FLOMAX) 0.4 MG CAPS capsule Take 0.4 mg by mouth at bedtime.   Yes [provider]  VICTOZA 18 MG/3ML SOPN Inject 1.8 mg into the skin every evening. 07/16/19  Yes [provider]    Allergies as of 07/17/2022 - Review Complete 07/17/2022  Allergen Reaction Noted   Fish allergy Anaphylaxis, Swelling, and Other (See Comments) 10/08/2012    Family History  Problem Relation Age of Onset   Colon cancer Maternal Grandmother    Colon polyps Mother    Diabetes Mother    Hypertension Mother    Hyperlipidemia Mother    Diabetes Father     Social History   Socioeconomic History   Marital status: Married    Spouse name: Not on file   Number of children: Not on file   Years of education: Not on file   Highest education level: Not on file  Occupational History   Occupation: Training and development officer: [employer]  Tobacco Use   Smoking status: Never   Smokeless tobacco: Never  Vaping Use   Vaping Use: Never used  Substance and Sexual Activity   Alcohol use: No   Drug use: No   Sexual activity: Yes  Other Topics Concern   Not on file  Social History Narrative   Not on file   Social Determinants of Health   Financial Resource Strain: Low Risk  (05/02/2022)   Overall Financial Resource Strain (CARDIA)    Difficulty of Paying Living Expenses: Not hard at all  Food Insecurity: No Food Insecurity (05/02/2022)   Hunger  Vital Sign    Worried About Running Out of Food in the Last Year: Never true    Ran Out of Food in the Last Year: Never true  Transportation Needs: No Transportation Needs (05/02/2022)   PRAPARE - Administrator, Civil Service (Medical): No    Lack of Transportation (Non-Medical): No  Physical Activity: Not on file  Stress: Not on file  Social Connections: Not on file  Intimate Partner Violence: Not on file    Review of Systems: See HPI, otherwise negative ROS  Physical Exam: BP (!) 170/83 (BP Location: Right Arm, Patient Position: Sitting, Cuff Size: Large)   Pulse 84   Temp (!) 97.5 F (36.4 C) (Oral)   Ht 6' (1.829 m)   Wt 256 lb 3.2 oz (116.2  kg)   SpO2 97%   BMI 34.75 kg/m  General:   Alert,  Well-developed, well-nourished, pleasant and cooperative in NAD Neck:  Supple; no masses or thyromegaly. No significant cervical adenopathy. Lungs:  Clear throughout to auscultation.   No wheezes, crackles, or rhonchi. No acute distress. Heart:  Regular rate and rhythm; no murmurs, clicks, rubs,  or gallops. Abdomen: Non-distended, normal bowel sounds.  Soft and nontender without appreciable mass or hepatosplenomegaly.  Pulses:  Normal pulses noted. Extremities:  Without clubbing or edema.  Impression/Plan: 61 year old gentleman with off and constipation diarrhea most likely secondary to mixed IBS.  GERD well controlled  Fatty liver with prior normal LFTs.  Positive family history colon cancer in first and second-degree relatives  No alarm symptoms at this time.  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  Continue taking Metamucil wafers every day-excellent source of fiber.  Add a daily probiotic such as align or Hardin Negus colon health to your regimen  Stop Bentyl for now  Use Imodium 2 mg each morning to combat loose stools.  On days you have  more loose stools you may take additional doses following the label directions on the bottle  Keep a stool diary.  Call me in 6 weeks and let me know how your bowels are doing  Plan to follow-up here in 3 months  No need to move up timing of colonoscopy set for 2025  Continue Protonix 40 mg daily-best taken 30 minutes before breakfast for acid reflux.  Call if you have any interim problems.  As discussed, blood pressure little high today just have it rechecked       Notice: This dictation was prepared with Dragon dictation along with smaller phrase technology. Any transcriptional errors that result from this process are unintentional and may not be corrected upon review.

## 2022-07-17 NOTE — Patient Instructions (Addendum)
It was good to see you again today!  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  Continue taking Metamucil wafers every day-excellent source of fiber.  Add a daily probiotic such as align or Hardin Negus colon health to your regimen  Stop Bentyl for now  Use Imodium 2 mg each morning to combat loose stools.  On days you have more loose stools you may take additional doses following the label directions on the bottle  Stool diary.  Call me in 6 weeks and let me know how your bowels are doing  Plan to follow-up here in 3 months  No need to move up timing of colonoscopy set for 2025  Continue Protonix 40 mg daily-best taken 30 minutes before breakfast for acid reflux.  Call if you have any interim problems.  As discussed, blood pressure little high today just have it rechecked

## 2022-07-19 DIAGNOSIS — Z79899 Other long term (current) drug therapy: Secondary | ICD-10-CM | POA: Diagnosis not present

## 2022-07-19 DIAGNOSIS — L4050 Arthropathic psoriasis, unspecified: Secondary | ICD-10-CM | POA: Diagnosis not present

## 2022-07-20 DIAGNOSIS — E1129 Type 2 diabetes mellitus with other diabetic kidney complication: Secondary | ICD-10-CM | POA: Diagnosis not present

## 2022-07-23 DIAGNOSIS — G894 Chronic pain syndrome: Secondary | ICD-10-CM | POA: Diagnosis not present

## 2022-07-23 DIAGNOSIS — M47812 Spondylosis without myelopathy or radiculopathy, cervical region: Secondary | ICD-10-CM | POA: Diagnosis not present

## 2022-07-23 DIAGNOSIS — M47816 Spondylosis without myelopathy or radiculopathy, lumbar region: Secondary | ICD-10-CM | POA: Diagnosis not present

## 2022-07-23 DIAGNOSIS — L4052 Psoriatic arthritis mutilans: Secondary | ICD-10-CM | POA: Diagnosis not present

## 2022-07-26 DIAGNOSIS — E1122 Type 2 diabetes mellitus with diabetic chronic kidney disease: Secondary | ICD-10-CM | POA: Diagnosis not present

## 2022-07-26 DIAGNOSIS — R7309 Other abnormal glucose: Secondary | ICD-10-CM | POA: Diagnosis not present

## 2022-07-26 DIAGNOSIS — I10 Essential (primary) hypertension: Secondary | ICD-10-CM | POA: Diagnosis not present

## 2022-08-16 DIAGNOSIS — M456 Ankylosing spondylitis lumbar region: Secondary | ICD-10-CM | POA: Diagnosis not present

## 2022-08-16 DIAGNOSIS — Z79899 Other long term (current) drug therapy: Secondary | ICD-10-CM | POA: Diagnosis not present

## 2022-10-07 IMAGING — RF DG CERVICAL SPINE 2 OR 3 VIEWS
1 series · 2 of 2 positions shown · non-contrast
Comparison: None.

CLINICAL DATA: ACDF C3-C4 C4-C5, C5-C6

EXAM:
DG C-ARM 1-60 MIN; CERVICAL SPINE - 2-3 VIEW
FLUOROSCOPY TIME:  Fluoroscopy Time:  12 seconds
Radiation Exposure Index (if provided by the fluoroscopic device):
2.7 mGy
Number of Acquired Spot Images: 2

[Series 1: run · 2 of 2 slices shown]
[im 1/2]
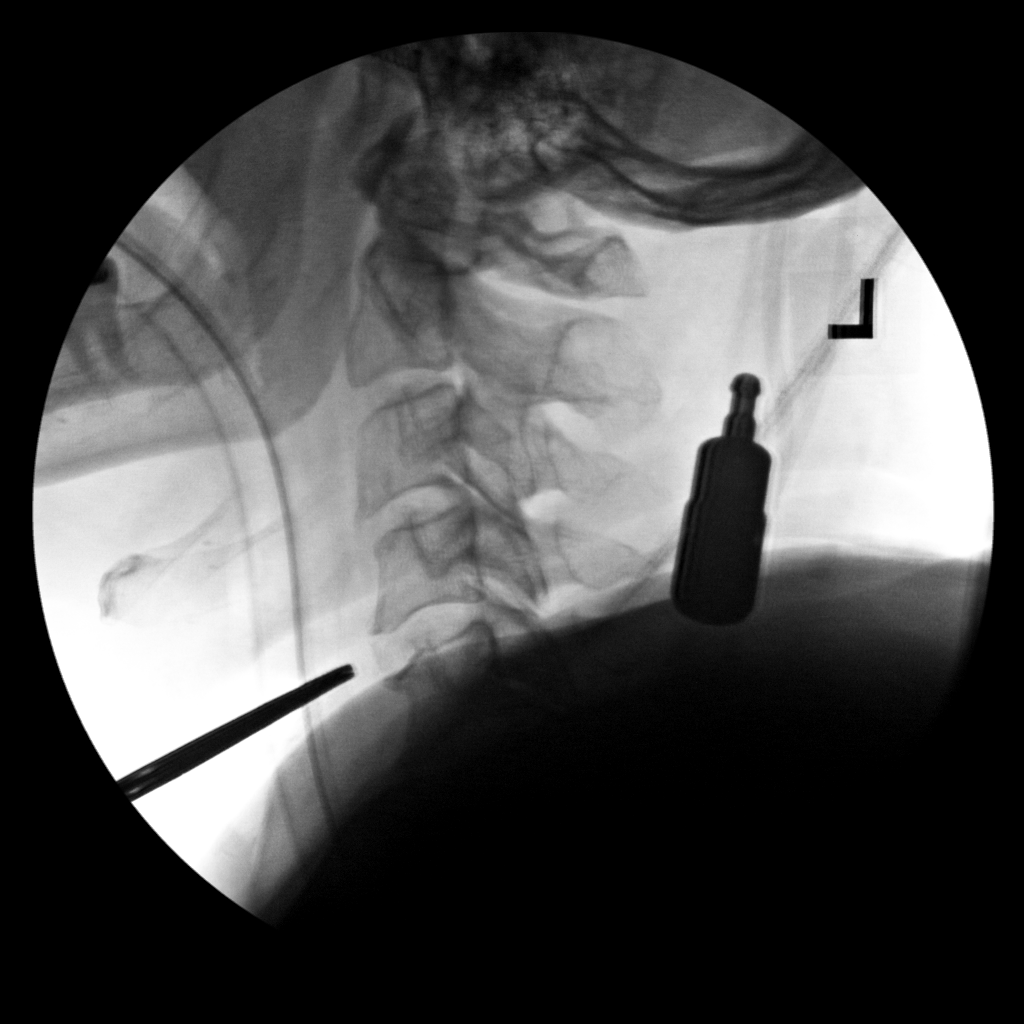
[im 2/2]
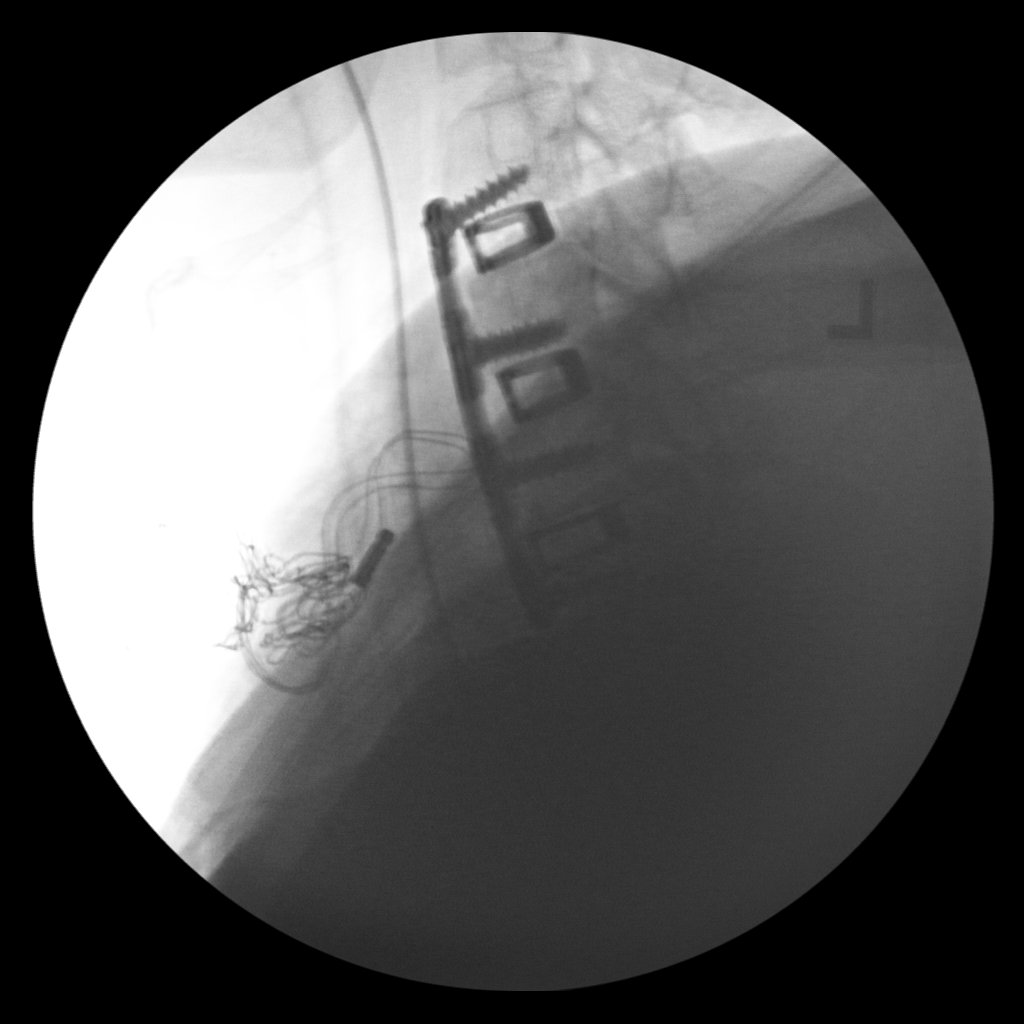

[2 of 2 positions shown; findings below may reference images not displayed]

FINDINGS: Two C-arm fluoroscopic images were obtained intraoperatively and
submitted for post operative interpretation. The initial image
demonstrates surgical probe with the tip projecting at the C5-C6
interspace anteriorly. Second image demonstrates C3 through C6 ACDF
with anterior plate and screws and intervening spacers. Please see
the performing provider's procedural report for further detail.
IMPRESSION: C3-C6 ACDF.

## 2022-10-07 IMAGING — RF DG C-ARM 1-60 MIN
1 series · 2 of 2 positions shown · non-contrast
Comparison: None.

CLINICAL DATA: ACDF C3-C4 C4-C5, C5-C6

EXAM:
DG C-ARM 1-60 MIN; CERVICAL SPINE - 2-3 VIEW
FLUOROSCOPY TIME:  Fluoroscopy Time:  12 seconds
Radiation Exposure Index (if provided by the fluoroscopic device):
2.7 mGy
Number of Acquired Spot Images: 2

[Series 1: run · 2 of 2 slices shown]
[im 1/2]
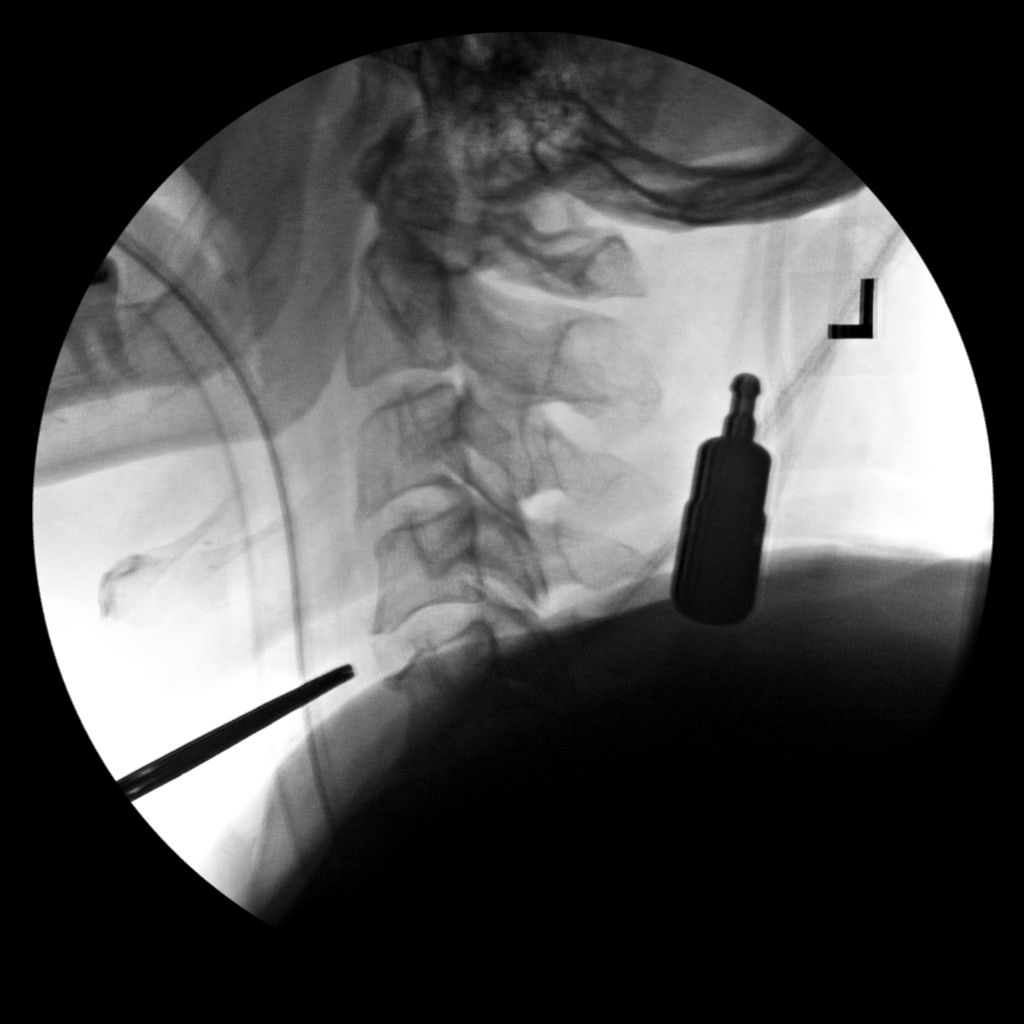
[im 2/2]
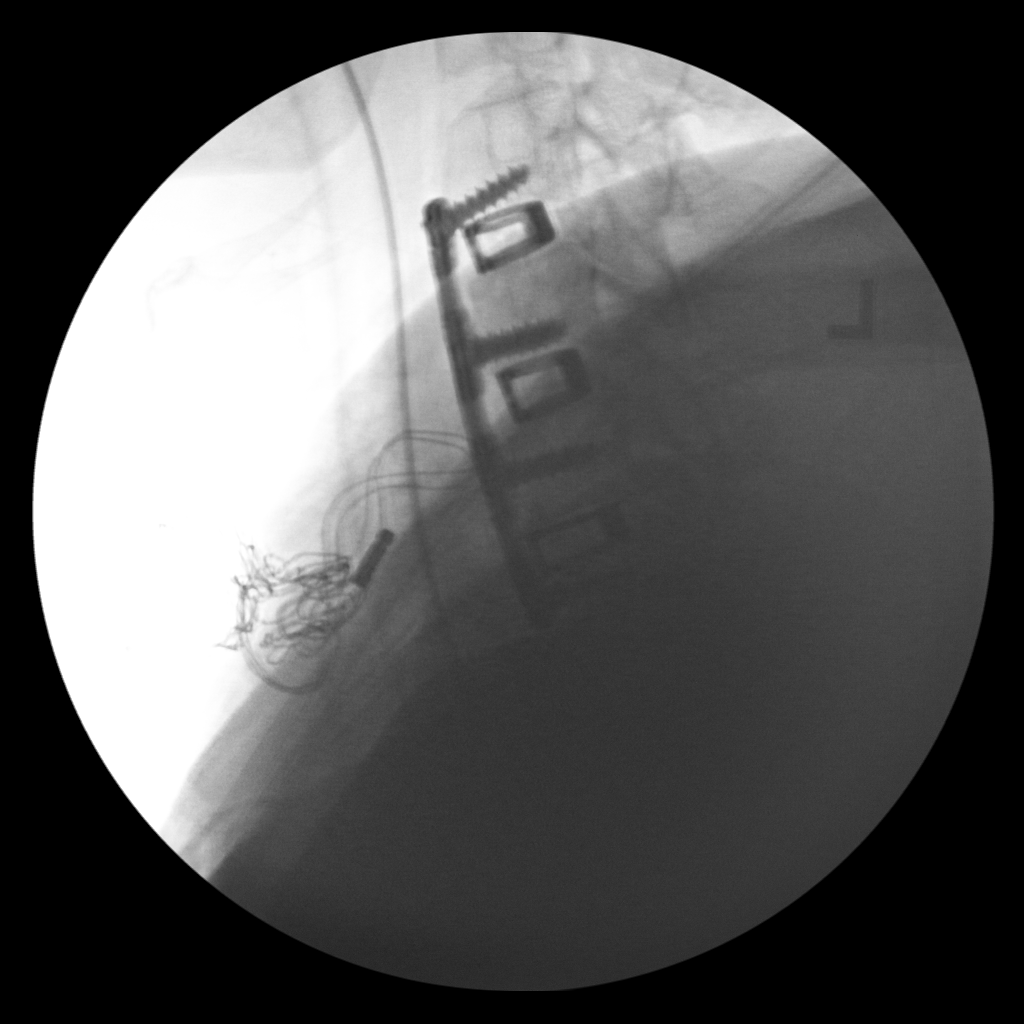

[2 of 2 positions shown; findings below may reference images not displayed]

FINDINGS: Two C-arm fluoroscopic images were obtained intraoperatively and
submitted for post operative interpretation. The initial image
demonstrates surgical probe with the tip projecting at the C5-C6
interspace anteriorly. Second image demonstrates C3 through C6 ACDF
with anterior plate and screws and intervening spacers. Please see
the performing provider's procedural report for further detail.
IMPRESSION: C3-C6 ACDF.

## 2022-10-11 DIAGNOSIS — L4050 Arthropathic psoriasis, unspecified: Secondary | ICD-10-CM | POA: Diagnosis not present

## 2022-10-11 DIAGNOSIS — Z79899 Other long term (current) drug therapy: Secondary | ICD-10-CM | POA: Diagnosis not present

## 2022-10-16 ENCOUNTER — Ambulatory Visit (INDEPENDENT_AMBULATORY_CARE_PROVIDER_SITE_OTHER): Payer: Medicare Other | Admitting: Internal Medicine

## 2022-10-16 ENCOUNTER — Encounter: Payer: Self-pay | Admitting: Internal Medicine

## 2022-10-16 ENCOUNTER — Other Ambulatory Visit: Payer: Self-pay

## 2022-10-16 VITALS — BP 120/71 | HR 72 | Temp 97.6°F | Ht 72.0 in | Wt 249.4 lb

## 2022-10-16 DIAGNOSIS — R198 Other specified symptoms and signs involving the digestive system and abdomen: Secondary | ICD-10-CM

## 2022-10-16 DIAGNOSIS — Z800 Family history of malignant neoplasm of digestive organs: Secondary | ICD-10-CM | POA: Diagnosis not present

## 2022-10-16 DIAGNOSIS — K219 Gastro-esophageal reflux disease without esophagitis: Secondary | ICD-10-CM

## 2022-10-16 MED ORDER — HYOSCYAMINE SULFATE ER 0.375 MG PO TB12: 0.3750 mg | ORAL_TABLET | Freq: Two times a day (BID) | ORAL | 3 refills | Status: DC

## 2022-10-16 NOTE — Patient Instructions (Addendum)
It was good to see you again today!  We will start Levbid 0.375 mg tablets take 1 in the morning and 1 in the evening (dispense 60 with 3 refills)  Medication should improve your cramps and diarrhea.  Has the potential to cause a dry mouth and some constipation.  Stop Metamucil for now; may continue probiotic daily  Can you Protonix 40 mg daily 30 minutes before breakfast  CBC, C-reactive protein, transglutaminase IgA, total IgA,  fecal calprotectin and immuno fecal occult blood test  Plan to see you back in 3 months  Please let me know in 3 weeks how you are doing with Levbid

## 2022-10-16 NOTE — Progress Notes (Signed)
Primary Care Physician:  Asencion Noble, MD Primary Gastroenterologist:  Dr.   Pre-Procedure History & Physical: HPI:  Steve Mccormick is a 62 y.o. male here for follow-up of intermittent abdominal cramping diarrhea.  Patient tells me that if he does not take something for diarrhea he has 4-5  stools daily sometimes gets cramping at night and has to have a bowel movement.  Rarely has a formed bowel movement  - no blood.  Has to look out for the bathrooms.  Hemoglobin A1c is in the 7 range.  Multiple meds for diabetes including Glucophage.  Also takes Celebrex.  Essentially negative colonoscopy 2020 (history of colon polyps and colon cancer); due for screening 2025.  He has lost 7 pounds trying to get into better shape. Also, takes Metamucil and align daily.  Reflux symptoms are well-controlled on Protonix 40 mg daily.  Past Medical History:  Diagnosis Date   Diabetes mellitus    Fatty liver    GERD (gastroesophageal reflux disease)    Hypertension    Psoriatic arthritis (HCC)    psoriatic and RA   Tinnitus    Wears hearing aid     Past Surgical History:  Procedure Laterality Date   ANKLE SURGERY     bilateral   ANTERIOR CERVICAL DECOMPRESSION/DISCECTOMY FUSION 4 LEVELS N/A 01/05/2021   Procedure: ANTERIOR CERVICAL DECOMPRESSION FUSION CERVICAL 3- CERVICAL4, CERVICAL 4- CERVICAL 5, CERVICAL, CERVICAL 5- CERVICAL 6 WITH INSTRUMENTATION AND ALLOGRAFT;  Surgeon: Phylliss Bob, MD;  Location: [place];  Service: Orthopedics;  Laterality: N/A;   BIOPSY  08/13/2019   Procedure: BIOPSY;  Surgeon: Daneil Dolin, MD;  [facility];  Service: Endoscopy;;  gastric esophageal   COLONOSCOPY N/A 10/22/2012   Dr. Gala Romney: colonic diverticulosis. next TCS in 5 years due to [facility] CRC, colon polyps   COLONOSCOPY WITH PROPOFOL N/A 08/13/2019   Dr. Gala Romney: diverticulosis, long redundant colon. due to personal history of polyps, next TCS in five years   ESOPHAGOGASTRODUODENOSCOPY (EGD) WITH  PROPOFOL N/A 08/13/2019   Dr. Gala Romney: 1cm salmon colored epithelium in distal esophagus (bx benign with no Barrett's), benign gastric polyp.   Ileocolonoscopy  10/02/2007   RMR: Friable anal canal, otherwise normal rectum.  Left-sided diverticula.  The colonic mucosa and  terminal ileum mucosa appeared normal rectum, left-sided diverticula   KNEE SURGERY     bilateral   plantar fasciitis Bilateral    SHOULDER SURGERY     bilateral   TOTAL HIP ARTHROPLASTY Left 09/01/2019   Procedure: TOTAL HIP ARTHROPLASTY ANTERIOR APPROACH;  Surgeon: Renette Butters, MD;  Location: [facility];  Service: Orthopedics;  Laterality: Left;    Prior to Admission medications   Medication Sig Start Date End Date Taking? Authorizing Provider  canagliflozin (INVOKANA) 300 MG TABS tablet Take 300 mg by mouth daily before breakfast.   Yes [provider]  celecoxib (CELEBREX) 200 MG capsule Take 200 mg by mouth 2 (two) times daily as needed. 04/26/22  Yes [provider]  cetirizine (ZYRTEC) 10 MG tablet Take 10 mg by mouth daily. Allergies   Yes [provider]  empagliflozin (JARDIANCE) 25 MG TABS tablet Take 25 mg by mouth daily.   Yes [provider]  glipiZIDE (GLUCOTROL XL) 10 MG 24 hr tablet Take 10 mg by mouth daily. 06/19/19  Yes [provider]  golimumab (SIMPONI ARIA) 50 MG/4ML SOLN injection 2mg /kg Intravenous 0,4 then every 8 weeks   Yes [provider]  leflunomide (ARAVA) 20  MG tablet Take 20 mg by mouth daily. 11/14/20  Yes [provider]  lisinopril (ZESTRIL) 10 MG tablet Take 10 mg by mouth daily. 06/23/19  Yes [provider]  metFORMIN (GLUCOPHAGE) 1000 MG tablet Take 1,000 mg by mouth 2 (two) times daily. 06/23/19  Yes [provider]  Multiple Vitamin (MULTIVITAMIN WITH MINERALS) TABS tablet Take 1 tablet by mouth every evening.   Yes [provider]  pantoprazole (PROTONIX) 40 MG tablet TAKE 1 TABLET BY MOUTH   DAILY BEFORE BREAKFAST 03/14/21  Yes Annitta Needs, NP  pioglitazone (ACTOS) 30 MG tablet Take 30 mg by mouth daily. 11/04/20  Yes [provider]  tamsulosin (FLOMAX) 0.4 MG CAPS capsule Take 0.4 mg by mouth at bedtime.   Yes [provider]  VICTOZA 18 MG/3ML SOPN Inject 1.8 mg into the skin every evening. 07/16/19  Yes [provider]    Allergies as of 10/16/2022 - Review Complete 10/16/2022  Allergen Reaction Noted   Fish allergy Anaphylaxis, Swelling, and Other (See Comments) 10/08/2012    Family History  Problem Relation Age of Onset   Colon cancer Maternal Grandmother    Colon polyps Mother    Diabetes Mother    Hypertension Mother    Hyperlipidemia Mother    Diabetes Father     Social History   Socioeconomic History   Marital status: Married    Spouse name: Not on file   Number of children: Not on file   Years of education: Not on file   Highest education level: Not on file  Occupational History   Occupation: Doctor, hospital: [employer]  Tobacco Use   Smoking status: Never   Smokeless tobacco: Never  Vaping Use   Vaping Use: Never used  Substance and Sexual Activity   Alcohol use: No   Drug use: No   Sexual activity: Yes  Other Topics Concern   Not on file  Social History Narrative   Not on file   Social Determinants of Health   Financial Resource Strain: Low Risk  (05/02/2022)   Overall Financial Resource Strain (CARDIA)    Difficulty of Paying Living Expenses: Not hard at all  Food Insecurity: No Food Insecurity (05/02/2022)   Hunger Vital Sign    Worried About Running Out of Food in the Last Year: Never true    [place] in the Last Year: Never true  Transportation Needs: No Transportation Needs (05/02/2022)   PRAPARE - Hydrologist (Medical): No    Lack of Transportation (Non-Medical): No  Physical Activity: Not on file  Stress: Not on file  Social Connections: Not on file   Intimate Partner Violence: Not on file    Review of Systems: See HPI, otherwise negative ROS  Physical Exam: BP 120/71 (BP Location: Right Arm, Patient Position: Sitting, Cuff Size: Large)   Pulse 72   Temp 97.6 F (36.4 C) (Oral)   Ht 6' (1.829 m)   Wt 249 lb 6.4 oz (113.1 kg)   SpO2 98%   BMI 33.82 kg/m  General:   Alert,  pleasant and cooperative in NAD Neck:  Supple; no masses or thyromegaly. No significant cervical adenopathy. Lungs:  Clear throughout to auscultation.   No wheezes, crackles, or rhonchi. No acute distress. Heart:  Regular rate and rhythm; no murmurs, clicks, rubs,  or gallops. Abdomen: Non-distended, normal bowel sounds.  Soft and nontender without appreciable mass or hepatosplenomegaly.  Pulses:  Normal pulses noted. Extremities:  Without clubbing or edema.  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  Without antidiarrheal agents he has diarrhea almost all the time as he reports today.  Diarrhea predominant symptoms.  This may be nothing more than IBS-D.  Diabetes and medication (Glucophage) may be a contributing factor.  Less likely I feel we are dealing with an organic process.  Essentially negative colonoscopy 2020.  Bowel symptoms at this time are certainly affecting his quality of life.  Daily Metamucil may not be helpful in this clinical setting.  History of fatty liver disease on prior ultrasound.  LFTs most recently normal.   GERD well-controlled on Protonix.  Recommendations:  We will start Levbid 0.375 mg tablets take 1 in the morning and 1 in the evening (dispense 60 with 3 refills)  Stop Metamucil for now; may continue probiotic daily  Continue Protonix 40 mg daily 30 minutes before breakfast  CBC, C-reactive protein, transglutaminase IgA, total IgA,  fecal calprotectin and immuno fecal occult blood test.  Strive for  normalization of hemoglobin A1c's.  Aerobic exercise 3 times weekly.  Screening colonoscopy 2025.  Plan to see  back 3 months.  Call in 3 weeks to provide a progress report on Lev-BID       Notice: This dictation was prepared with Dragon dictation along with smaller phrase technology. Any transcriptional errors that result from this process are unintentional and may not be corrected upon review.

## 2022-11-05 ENCOUNTER — Ambulatory Visit (INDEPENDENT_AMBULATORY_CARE_PROVIDER_SITE_OTHER): Payer: Medicare Other | Admitting: Internal Medicine

## 2022-11-05 ENCOUNTER — Other Ambulatory Visit: Payer: Self-pay

## 2022-11-05 DIAGNOSIS — R198 Other specified symptoms and signs involving the digestive system and abdomen: Secondary | ICD-10-CM

## 2022-11-05 DIAGNOSIS — L409 Psoriasis, unspecified: Secondary | ICD-10-CM | POA: Diagnosis not present

## 2022-11-05 DIAGNOSIS — Z1589 Genetic susceptibility to other disease: Secondary | ICD-10-CM | POA: Diagnosis not present

## 2022-11-05 DIAGNOSIS — M1991 Primary osteoarthritis, unspecified site: Secondary | ICD-10-CM | POA: Diagnosis not present

## 2022-11-05 DIAGNOSIS — Z79899 Other long term (current) drug therapy: Secondary | ICD-10-CM | POA: Diagnosis not present

## 2022-11-05 DIAGNOSIS — M456 Ankylosing spondylitis lumbar region: Secondary | ICD-10-CM | POA: Diagnosis not present

## 2022-11-05 DIAGNOSIS — L4050 Arthropathic psoriasis, unspecified: Secondary | ICD-10-CM | POA: Diagnosis not present

## 2022-11-05 DIAGNOSIS — K219 Gastro-esophageal reflux disease without esophagitis: Secondary | ICD-10-CM | POA: Diagnosis not present

## 2022-11-05 LAB — IFOBT (OCCULT BLOOD): IFOBT: NEGATIVE

## 2022-11-06 LAB — CBC WITH DIFFERENTIAL/PLATELET
Basophils Absolute: 0.1 10*3/uL (ref 0.0–0.2)
Basos: 1 %
EOS (ABSOLUTE): 0.1 10*3/uL (ref 0.0–0.4)
Eos: 2 %
Hematocrit: 42.6 % (ref 37.5–51.0)
Hemoglobin: 14 g/dL (ref 13.0–17.7)
Immature Grans (Abs): 0 10*3/uL (ref 0.0–0.1)
Immature Granulocytes: 0 %
Lymphocytes Absolute: 2.3 10*3/uL (ref 0.7–3.1)
Lymphs: 36 %
MCH: 27.3 pg (ref 26.6–33.0)
MCHC: 32.9 g/dL (ref 31.5–35.7)
MCV: 83 fL (ref 79–97)
Monocytes Absolute: 0.5 10*3/uL (ref 0.1–0.9)
Monocytes: 8 %
Neutrophils Absolute: 3.5 10*3/uL (ref 1.4–7.0)
Neutrophils: 53 %
Platelets: 240 10*3/uL (ref 150–450)
RBC: 5.12 x10E6/uL (ref 4.14–5.80)
RDW: 13.1 % (ref 11.6–15.4)
WBC: 6.4 10*3/uL (ref 3.4–10.8)

## 2022-11-06 LAB — C-REACTIVE PROTEIN: CRP: <1 mg/L (ref 0–10)

## 2022-11-12 ENCOUNTER — Other Ambulatory Visit: Payer: Self-pay | Admitting: Internal Medicine

## 2022-11-12 ENCOUNTER — Other Ambulatory Visit: Payer: Self-pay

## 2022-11-12 DIAGNOSIS — K219 Gastro-esophageal reflux disease without esophagitis: Secondary | ICD-10-CM | POA: Diagnosis not present

## 2022-11-12 DIAGNOSIS — R198 Other specified symptoms and signs involving the digestive system and abdomen: Secondary | ICD-10-CM

## 2022-11-14 LAB — TISSUE TRANSGLUTAMINASE, IGA: Transglutaminase IgA: <2 U/mL (ref 0–3)

## 2022-11-14 LAB — IGA: IgA/Immunoglobulin A, Serum: 125 mg/dL (ref 61–437)

## 2022-11-16 LAB — CALPROTECTIN, FECAL: Calprotectin, Fecal: 41 ug/g (ref 0–120)

## 2022-12-11 DIAGNOSIS — L4050 Arthropathic psoriasis, unspecified: Secondary | ICD-10-CM | POA: Diagnosis not present

## 2022-12-11 DIAGNOSIS — Z79899 Other long term (current) drug therapy: Secondary | ICD-10-CM | POA: Diagnosis not present

## 2022-12-11 DIAGNOSIS — M456 Ankylosing spondylitis lumbar region: Secondary | ICD-10-CM | POA: Diagnosis not present

## 2022-12-23 ENCOUNTER — Emergency Department (HOSPITAL_COMMUNITY): Payer: BC Managed Care – PPO

## 2022-12-23 ENCOUNTER — Emergency Department (HOSPITAL_COMMUNITY)
Admission: EM | Admit: 2022-12-23 | Discharge: 2022-12-23 | Disposition: A | Discharge status: Home / Self Care | Payer: BC Managed Care – PPO | Attending: Emergency Medicine | Admitting: Emergency Medicine

## 2022-12-23 ENCOUNTER — Encounter (HOSPITAL_COMMUNITY): Payer: Self-pay | Admitting: *Deleted

## 2022-12-23 ENCOUNTER — Other Ambulatory Visit: Payer: Self-pay

## 2022-12-23 DIAGNOSIS — M4316 Spondylolisthesis, lumbar region: Secondary | ICD-10-CM | POA: Diagnosis not present

## 2022-12-23 DIAGNOSIS — Z79899 Other long term (current) drug therapy: Secondary | ICD-10-CM | POA: Insufficient documentation

## 2022-12-23 DIAGNOSIS — E119 Type 2 diabetes mellitus without complications: Secondary | ICD-10-CM | POA: Insufficient documentation

## 2022-12-23 DIAGNOSIS — R109 Unspecified abdominal pain: Secondary | ICD-10-CM | POA: Diagnosis not present

## 2022-12-23 DIAGNOSIS — M5442 Lumbago with sciatica, left side: Secondary | ICD-10-CM | POA: Insufficient documentation

## 2022-12-23 DIAGNOSIS — I10 Essential (primary) hypertension: Secondary | ICD-10-CM | POA: Diagnosis not present

## 2022-12-23 DIAGNOSIS — K760 Fatty (change of) liver, not elsewhere classified: Secondary | ICD-10-CM | POA: Diagnosis not present

## 2022-12-23 DIAGNOSIS — M545 Low back pain, unspecified: Secondary | ICD-10-CM | POA: Diagnosis present

## 2022-12-23 DIAGNOSIS — Z7984 Long term (current) use of oral hypoglycemic drugs: Secondary | ICD-10-CM | POA: Insufficient documentation

## 2022-12-23 DIAGNOSIS — N281 Cyst of kidney, acquired: Secondary | ICD-10-CM | POA: Diagnosis not present

## 2022-12-23 LAB — URINALYSIS, ROUTINE W REFLEX MICROSCOPIC
Bacteria, UA: NONE SEEN
Bilirubin Urine: NEGATIVE
Glucose, UA: >=500 mg/dL — AB
Hgb urine dipstick: NEGATIVE
Ketones, ur: 20 mg/dL — AB
Leukocytes,Ua: NEGATIVE
Nitrite: NEGATIVE
Protein, ur: NEGATIVE mg/dL
Specific Gravity, Urine: 1.029 (ref 1.005–1.030)
pH: 7 (ref 5.0–8.0)

## 2022-12-23 LAB — BASIC METABOLIC PANEL
Anion gap: 11 (ref 5–15)
BUN: 23 mg/dL (ref 8–23)
CO2: 21 mmol/L — ABNORMAL LOW (ref 22–32)
Calcium: 8.8 mg/dL — ABNORMAL LOW (ref 8.9–10.3)
Chloride: 103 mmol/L (ref 98–111)
Creatinine, Ser: 0.81 mg/dL (ref 0.61–1.24)
GFR, Estimated: >60 mL/min (ref >60)
Glucose, Bld: 102 mg/dL — ABNORMAL HIGH (ref 70–99)
Potassium: 3.5 mmol/L (ref 3.5–5.1)
Sodium: 135 mmol/L (ref 135–145)

## 2022-12-23 LAB — CBC
HCT: 39.7 % (ref 39.0–52.0)
Hemoglobin: 13 g/dL (ref 13.0–17.0)
MCH: 27.6 pg (ref 26.0–34.0)
MCHC: 32.7 g/dL (ref 30.0–36.0)
MCV: 84.3 fL (ref 80.0–100.0)
Platelets: 264 10*3/uL (ref 150–400)
RBC: 4.71 MIL/uL (ref 4.22–5.81)
RDW: 14.2 % (ref 11.5–15.5)
WBC: 9.3 10*3/uL (ref 4.0–10.5)
nRBC: 0 % (ref 0.0–0.2)

## 2022-12-23 MED ORDER — FENTANYL CITRATE PF 50 MCG/ML IJ SOSY
50.0000 ug | PREFILLED_SYRINGE | Freq: Once | INTRAMUSCULAR | Status: AC
  Administered 2022-12-23: 50 ug via INTRAVENOUS
  Filled 2022-12-23: qty 1

## 2022-12-23 MED ORDER — HYDROCODONE-ACETAMINOPHEN 5-325 MG PO TABS: 1.0000 | ORAL_TABLET | Freq: Four times a day (QID) | ORAL | 0 refills | Status: AC | PRN

## 2022-12-23 MED ORDER — METHYLPREDNISOLONE 4 MG PO TBPK: ORAL_TABLET | ORAL | 0 refills | Status: DC

## 2022-12-23 MED ORDER — CYCLOBENZAPRINE HCL 10 MG PO TABS
10.0000 mg | ORAL_TABLET | Freq: Once | ORAL | Status: AC
  Administered 2022-12-23: 10 mg via ORAL
  Filled 2022-12-23: qty 1

## 2022-12-23 MED ORDER — KETOROLAC TROMETHAMINE 15 MG/ML IJ SOLN
30.0000 mg | Freq: Once | INTRAMUSCULAR | Status: AC
  Administered 2022-12-23: 30 mg via INTRAVENOUS
  Filled 2022-12-23: qty 2

## 2022-12-23 NOTE — ED Notes (Signed)
Pt having pain still. Requested med from provider. Pillow and blanket given

## 2022-12-23 NOTE — ED Notes (Signed)
ED Provider at bedside. 

## 2022-12-23 NOTE — Discharge Instructions (Addendum)
You were seen in the emergency department today for back pain.   I suspect this is likely caused by sciatica of your left side due to degenerative changes in your lumbar spine. Your imaging showed no evidence of a kidney stone and your blood work today was reassuring.   Rest, gentle exercise and stretching will aid in recovery from any injuries to your back.  Using medication such as Tylenol and ibuprofen will help alleviate pain as well as decrease swelling and inflammation associated with these injuries. You may use 600 mg ibuprofen every 6 hours or 1000 mg of Tylenol every 6 hours.  You may choose to alternate between the 2.  This would be most effective.  Not to exceed 4 g of Tylenol within 24 hours.  Not to exceed 3200 mg ibuprofen 24 hours.  I have prescribed you: a course of steroids and short course of pain medication  Salt water/Epson salt soaks, massage, icy hot/Biofreeze/BenGay and other similar products can help with symptoms.  Please return to the emergency department for reevaluation if you denies any new or concerning symptoms such as fever, new numbness or weakness in your legs, difficulty using the restroom or incontinence.

## 2022-12-23 NOTE — ED Triage Notes (Signed)
Pt with left flank pain ongoing since Thursday, gotten worse over the last hour.  Burning with last void today per pt. Denies any hx of kidney stones

## 2022-12-23 NOTE — ED Provider Notes (Signed)
[place] EMERGENCY DEPARTMENT AT [facility] Provider Note   CSN: [id number] Arrival date & time: 12/23/22  1847     History  Chief Complaint  Patient presents with   Flank Pain    Steve Mccormick is a 62 y.o. male with history of diabetes, fatty liver, GERD, hypertension, psoriatic arthritis who presents to the emergency department complaining of left-sided flank pain for the past 4 days.  States that its gotten worse about an hour prior to ER arrival.  Complaining of burning with last urination prior to presentation.  Denies any history of kidney stones.  Reports having severe "spinal issues".  Reports numerous past surgeries.  States that the problem is the worst at L5-S1.  He is intermittently having numbness radiating down into his left leg.  Pain is sometimes radiating into the left side of his groin.  No saddle paresthesias, urinary retention, urine or bowel incontinence. Describes the pain as sharp with muscle cramping/spasms.    Flank Pain       Home Medications Prior to Admission medications   Medication Sig Start Date End Date Taking? Authorizing Provider  HYDROcodone-acetaminophen (NORCO/VICODIN) 5-325 MG tablet Take 1-2 tablets by mouth every 6 (six) hours as needed for up to 3 days. 12/23/22 12/26/22 Yes Kendrik Mcshan T, PA-C  methylPREDNISolone (MEDROL DOSEPAK) 4 MG TBPK tablet Take per package instructions 12/23/22  Yes Bonnell Placzek T, PA-C  canagliflozin (INVOKANA) 300 MG TABS tablet Take 300 mg by mouth daily before breakfast.    [provider]  celecoxib (CELEBREX) 200 MG capsule Take 200 mg by mouth 2 (two) times daily as needed. 04/26/22   [provider]  cetirizine (ZYRTEC) 10 MG tablet Take 10 mg by mouth daily. Allergies    [provider]  empagliflozin (JARDIANCE) 25 MG TABS tablet Take 25 mg by mouth daily.    [provider]  glipiZIDE (GLUCOTROL XL) 10 MG 24 hr tablet Take 10 mg by mouth daily.  06/19/19   [provider]  golimumab (SIMPONI ARIA) 50 MG/4ML SOLN injection 2mg /kg Intravenous 0,4 then every 8 weeks    [provider]  hyoscyamine (LEVBID) 0.375 MG 12 hr tablet Take 1 tablet (0.375 mg total) by mouth 2 (two) times daily. 10/16/22   Rourk, Gerrit Friends, MD  leflunomide (ARAVA) 20 MG tablet Take 20 mg by mouth daily. 11/14/20   [provider]  lisinopril (ZESTRIL) 10 MG tablet Take 10 mg by mouth daily. 06/23/19   [provider]  metFORMIN (GLUCOPHAGE) 1000 MG tablet Take 1,000 mg by mouth 2 (two) times daily. 06/23/19   [provider]  Multiple Vitamin (MULTIVITAMIN WITH MINERALS) TABS tablet Take 1 tablet by mouth every evening.    [provider]  pantoprazole (PROTONIX) 40 MG tablet TAKE 1 TABLET BY MOUTH  DAILY BEFORE BREAKFAST 03/14/21   Gelene Mink, NP  pioglitazone (ACTOS) 30 MG tablet Take 30 mg by mouth daily. 11/04/20   [provider]  tamsulosin (FLOMAX) 0.4 MG CAPS capsule Take 0.4 mg by mouth at bedtime.    [provider]  VICTOZA 18 MG/3ML SOPN Inject 1.8 mg into the skin every evening. 07/16/19   [provider]      Allergies    Fish allergy    Review of Systems   Review of Systems  Genitourinary:  Positive for flank pain.  Musculoskeletal:  Positive for back pain.  Neurological:  Positive for numbness.  All other systems reviewed and  are negative.   Physical Exam Updated Vital Signs BP 125/73   Pulse 82   Temp 97.8 F (36.6 C) (Temporal)   Resp 20   Ht 6' (1.829 m)   Wt 113.4 kg   SpO2 99%   BMI 33.91 kg/m  Physical Exam Vitals and nursing note reviewed.  Constitutional:      Appearance: Normal appearance.  HENT:     Head: Normocephalic and atraumatic.  Eyes:     Conjunctiva/sclera: Conjunctivae normal.  Cardiovascular:     Rate and Rhythm: Normal rate and regular rhythm.  Pulmonary:     Effort: Pulmonary effort is normal. No respiratory distress.      Breath sounds: Normal breath sounds.  Abdominal:     General: There is no distension.     Palpations: Abdomen is soft.     Tenderness: There is no abdominal tenderness.  Musculoskeletal:     Comments: Muscle cramping noted to the lumbar musculature. Pain exacerbation with minimal movement of the back. No midline spinal tenderness, step offs or crepitus. Normal sensation in bilateral lower extremities with patient sitting in exam chair.   Skin:    General: Skin is warm and dry.  Neurological:     General: No focal deficit present.     Mental Status: He is alert.     ED Results / Procedures / Treatments   Labs (all labs ordered are listed, but only abnormal results are displayed) Labs Reviewed  URINALYSIS, ROUTINE W REFLEX MICROSCOPIC - Abnormal; Notable for the following components:      Result Value   Glucose, UA >=500 (*)    Ketones, ur 20 (*)    All other components within normal limits  BASIC METABOLIC PANEL - Abnormal; Notable for the following components:   CO2 21 (*)    Glucose, Bld 102 (*)    Calcium 8.8 (*)    All other components within normal limits  CBC    EKG None  Radiology CT L-SPINE NO CHARGE  Result Date: 12/23/2022 CLINICAL DATA:  Back pain. EXAM: CT LUMBAR SPINE WITHOUT CONTRAST TECHNIQUE: Multidetector CT imaging of the lumbar spine was performed without intravenous contrast administration. Multiplanar CT image reconstructions were also generated. RADIATION DOSE REDUCTION: This exam was performed according to the departmental dose-optimization program which includes automated exposure control, adjustment of the mA and/or kV according to patient size and/or use of iterative reconstruction technique. COMPARISON:  Lumbar MRI 11/31/2015 FINDINGS: Segmentation: 5 lumbar type vertebrae. Alignment: 4 mm retrolisthesis of L5 on S1, chronic. There is also trace retrolisthesis of L2 on L3. Vertebrae: No acute fracture or focal pathologic process. Paraspinal and other  soft tissues: Abdominopelvic viscera assessed on concurrent abdominopelvic CT. No paraspinal muscle abnormality. Disc levels: Diffuse disc bulge with disc space narrowing from L1-L2 through L5-S1. There is diffuse spinal canal narrowing. Facet hypertrophy at L4-L5. IMPRESSION: 1. No acute osseous abnormalities. 2. Diffuse multilevel degenerative disc disease with diffuse spinal canal narrowing. Electronically Signed   By: Narda Rutherford M.D.   On: 12/23/2022 22:19   CT Renal Stone Study  Result Date: 12/23/2022 CLINICAL DATA:  Left flank pain EXAM: CT ABDOMEN AND PELVIS WITHOUT CONTRAST TECHNIQUE: Multidetector CT imaging of the abdomen and pelvis was performed following the standard protocol without IV contrast. RADIATION DOSE REDUCTION: This exam was performed according to the departmental dose-optimization program which includes automated exposure control, adjustment of the mA and/or kV according to patient size and/or use of iterative reconstruction technique. COMPARISON:  None  Available. FINDINGS: Lower chest: Mild scarring/atelectasis in the bilateral lower lobes. Hepatobiliary: Mild hepatic steatosis. Gallbladder is unremarkable. No intrahepatic or extrahepatic dilatation. Pancreas: Within normal limits. Spleen: Within normal limits. Adrenals/Urinary Tract: Adrenal glands are within normal limits. 13 mm cyst in the lateral left lower kidney (series 2/image 42), measuring simple fluid density, benign (Bosniak I). Right kidney is within normal limits. No renal, ureteral, or bladder calculi. No hydronephrosis. Bladder is within normal limits, noting streak artifact. Stomach/Bowel: Stomach is within normal limits. No evidence of bowel obstruction. Appendix is not discretely visualized. No colonic wall thickening or inflammatory changes. Vascular/Lymphatic: No evidence of abdominal aortic aneurysm. Atherosclerotic calcifications of the abdominal aorta and branch vessels. No suspicious abdominopelvic  lymphadenopathy. Reproductive: Mild prostatomegaly. Other: No abdominopelvic ascites. Musculoskeletal: Degenerative changes of the visualized thoracolumbar spine. Left hip arthroplasty, without evidence of complication. Mild degenerative changes of the right hip. IMPRESSION: No renal, ureteral, or bladder calculi. No hydronephrosis. 13 mm cyst in the lateral left lower kidney, benign (Bosniak I). No follow-up is recommended. Mild hepatic steatosis. Electronically Signed   By: Charline Bills M.D.   On: 12/23/2022 22:16    Procedures Procedures    Medications Ordered in ED Medications  ketorolac (TORADOL) 15 MG/ML injection 30 mg (30 mg Intravenous Given 12/23/22 2129)  fentaNYL (SUBLIMAZE) injection 50 mcg (50 mcg Intravenous Given 12/23/22 2145)  cyclobenzaprine (FLEXERIL) tablet 10 mg (10 mg Oral Given 12/23/22 2145)    ED Course/ Medical Decision Making/ A&P                             Medical Decision Making Amount and/or Complexity of Data Reviewed Labs: ordered.   This patient is a 62 y.o. male who presents to the ED for concern of left sided flank/back pain.   Differential diagnoses prior to evaluation: Fracture (acute/chronic), muscle strain, cauda equina, spinal stenosis, DDD, ligamentous injury, disk herniation, metastatic cancer, vertebral osteomyelitis, kidney stone, pyelonephritis, AAA, pancreatitis, bowel obstruction, meningitis  Past Medical History / Social History / Additional history: Chart reviewed. Pertinent results include: diabetes, fatty liver, GERD, hypertension, psoriatic arthritis, ankylosing spondylitis  Physical Exam: Physical exam performed. The pertinent findings include: CBC and BMP unremarkable.  Urinalysis with glucose and ketones, negative for hematuria or infection.  Imaging: CT renal stone study with CT lumbar spine shows no stones, benign left renal cyst, and multilevel degenerative disc disease with diffuse spinal canal narrowing. I reviewed the  images and agree with the radiologist interpretation.   Medications / Treatment: Patient given toradol, fentanyl, and flexeril   Disposition: After consideration of the diagnostic results and the patients response to treatment, I feel that emergency department workup does not suggest an emergent condition requiring admission or immediate intervention beyond what has been performed at this time. Patient with back pain.  No neurological deficits and normal neuro exam.  Patient can walk but states is painful.  No loss of bowel or bladder control.  No concern for cauda equina.  No fever, night sweats, weight loss, h/o cancer, IVDU.  The plan is: discharge to home with medrol dose pack and recommend follow up with previous spine specialist. The patient is safe for discharge and has been instructed to return immediately for worsening symptoms, change in symptoms or any other concerns.  I discussed this case with my attending physician Dr. Hyacinth Meeker who cosigned this note including patient's presenting symptoms, physical exam, and planned diagnostics and interventions. Attending physician stated  agreement with plan or made changes to plan which were implemented.   Final Clinical Impression(s) / ED Diagnoses Final diagnoses:  Acute left-sided low back pain with left-sided sciatica    Rx / DC Orders ED Discharge Orders          Ordered    HYDROcodone-acetaminophen (NORCO/VICODIN) 5-325 MG tablet  Every 6 hours PRN        12/23/22 2225    methylPREDNISolone (MEDROL DOSEPAK) 4 MG TBPK tablet        12/23/22 2225           Portions of this report may have been transcribed using voice recognition software. Every effort was made to ensure accuracy; however, inadvertent computerized transcription errors may be present.    Jeanella Flattery 12/23/22 2226    Eber Hong, MD 12/24/22 2041

## 2022-12-25 ENCOUNTER — Telehealth: Payer: Self-pay

## 2022-12-25 NOTE — Telephone Encounter (Signed)
     Patient  visit on 4/14  at [facility]  Have you been able to follow up with your primary care physician? Yes   The patient was or was not able to obtain any needed medicine or equipment. Yes  Are there diet recommendations that you are having difficulty following? na  Patient expresses understanding of discharge instructions and education provided has no other needs at this time.  Yes    Lenard Forth [facility] Guide, [facility] [phone] [address] [place], [place], [place] [address] Phone: [phone] Email: [email or web address]

## 2022-12-31 DIAGNOSIS — M545 Low back pain, unspecified: Secondary | ICD-10-CM | POA: Diagnosis not present

## 2023-01-03 DIAGNOSIS — I10 Essential (primary) hypertension: Secondary | ICD-10-CM | POA: Diagnosis not present

## 2023-01-03 DIAGNOSIS — Z79899 Other long term (current) drug therapy: Secondary | ICD-10-CM | POA: Diagnosis not present

## 2023-01-03 DIAGNOSIS — L4052 Psoriatic arthritis mutilans: Secondary | ICD-10-CM | POA: Diagnosis not present

## 2023-01-03 DIAGNOSIS — E1129 Type 2 diabetes mellitus with other diabetic kidney complication: Secondary | ICD-10-CM | POA: Diagnosis not present

## 2023-01-04 DIAGNOSIS — M545 Low back pain, unspecified: Secondary | ICD-10-CM | POA: Diagnosis not present

## 2023-01-09 DIAGNOSIS — I10 Essential (primary) hypertension: Secondary | ICD-10-CM | POA: Diagnosis not present

## 2023-01-09 DIAGNOSIS — G720 Drug-induced myopathy: Secondary | ICD-10-CM | POA: Diagnosis not present

## 2023-01-09 DIAGNOSIS — L4052 Psoriatic arthritis mutilans: Secondary | ICD-10-CM | POA: Diagnosis not present

## 2023-01-09 DIAGNOSIS — E1169 Type 2 diabetes mellitus with other specified complication: Secondary | ICD-10-CM | POA: Diagnosis not present

## 2023-01-09 DIAGNOSIS — Z0000 Encounter for general adult medical examination without abnormal findings: Secondary | ICD-10-CM | POA: Diagnosis not present

## 2023-01-11 ENCOUNTER — Other Ambulatory Visit: Payer: Self-pay | Admitting: Orthopedic Surgery

## 2023-01-11 DIAGNOSIS — M5459 Other low back pain: Secondary | ICD-10-CM

## 2023-01-14 ENCOUNTER — Ambulatory Visit
Admission: RE | Admit: 2023-01-14 | Discharge: 2023-01-14 | Disposition: A | Discharge status: Home / Self Care | Payer: BC Managed Care – PPO | Source: Ambulatory Visit | Attending: Orthopedic Surgery | Admitting: Orthopedic Surgery

## 2023-01-14 DIAGNOSIS — M5459 Other low back pain: Secondary | ICD-10-CM

## 2023-01-14 DIAGNOSIS — M545 Low back pain, unspecified: Secondary | ICD-10-CM | POA: Diagnosis not present

## 2023-01-15 ENCOUNTER — Encounter: Payer: Self-pay | Admitting: Internal Medicine

## 2023-01-15 ENCOUNTER — Ambulatory Visit (INDEPENDENT_AMBULATORY_CARE_PROVIDER_SITE_OTHER): Payer: Medicare Other | Admitting: Internal Medicine

## 2023-01-15 VITALS — BP 133/86 | HR 72 | Temp 97.9°F | Ht 72.0 in | Wt 252.0 lb

## 2023-01-15 DIAGNOSIS — K580 Irritable bowel syndrome with diarrhea: Secondary | ICD-10-CM

## 2023-01-15 DIAGNOSIS — K219 Gastro-esophageal reflux disease without esophagitis: Secondary | ICD-10-CM | POA: Diagnosis not present

## 2023-01-15 NOTE — Progress Notes (Unsigned)
Primary Care Physician:  Carylon Perches, MD Primary Gastroenterologist:  Dr. Jena Gauss  Pre-Procedure History & Physical: HPI:  Steve Mccormick is a 62 y.o. male here for here for follow-up of GERD and postprandial abdominal cramps diarrhea.  Inflammatory markers all negative.  Celiac screen negative.  Levbid twice daily added to his regimen Metamucil held.  Bowel function has returned to almost normal 1-2 formed bowel movements daily.  Very happy.  GERD well-controlled.  No dysphagia.  Big issues with his lower back apparently has some bulging disc disease that he is seeing his surgeon for possible treatment.  Has not seen Dr. Ouida Sills lately  Past Medical History:  Diagnosis Date   Diabetes mellitus    Fatty liver    GERD (gastroesophageal reflux disease)    Hypertension    Psoriatic arthritis (HCC)    psoriatic and RA   Tinnitus    Wears hearing aid     Past Surgical History:  Procedure Laterality Date   ANKLE SURGERY     bilateral   ANTERIOR CERVICAL DECOMPRESSION/DISCECTOMY FUSION 4 LEVELS N/A 01/05/2021   Procedure: ANTERIOR CERVICAL DECOMPRESSION FUSION CERVICAL 3- CERVICAL4, CERVICAL 4- CERVICAL 5, CERVICAL, CERVICAL 5- CERVICAL 6 WITH INSTRUMENTATION AND ALLOGRAFT;  Surgeon: Estill Bamberg, MD;  Location: [facility];  Service: Orthopedics;  Laterality: N/A;   BIOPSY  08/13/2019   Procedure: BIOPSY;  Surgeon: Corbin Ade, MD;  [facility];  Service: Endoscopy;;  gastric esophageal   COLONOSCOPY N/A 10/22/2012   Dr. Jena Gauss: colonic diverticulosis. next TCS in 5 years due to [facility] CRC, colon polyps   COLONOSCOPY WITH PROPOFOL N/A 08/13/2019   Dr. Jena Gauss: diverticulosis, long redundant colon. due to personal history of polyps, next TCS in five years   ESOPHAGOGASTRODUODENOSCOPY (EGD) WITH PROPOFOL N/A 08/13/2019   Dr. Jena Gauss: 1cm salmon colored epithelium in distal esophagus (bx benign with no Barrett's), benign gastric polyp.   Ileocolonoscopy  10/02/2007   RMR: Friable anal  canal, otherwise normal rectum.  Left-sided diverticula.  The colonic mucosa and  terminal ileum mucosa appeared normal rectum, left-sided diverticula   KNEE SURGERY     bilateral   plantar fasciitis Bilateral    SHOULDER SURGERY     bilateral   TOTAL HIP ARTHROPLASTY Left 09/01/2019   Procedure: TOTAL HIP ARTHROPLASTY ANTERIOR APPROACH;  Surgeon: Sheral Apley, MD;  Location: [facility];  Service: Orthopedics;  Laterality: Left;    Prior to Admission medications   Medication Sig Start Date End Date Taking? Authorizing Provider  canagliflozin (INVOKANA) 300 MG TABS tablet Take 300 mg by mouth daily before breakfast.   Yes [provider]  celecoxib (CELEBREX) 200 MG capsule Take 200 mg by mouth 2 (two) times daily as needed. 04/26/22  Yes [provider]  cetirizine (ZYRTEC) 10 MG tablet Take 10 mg by mouth daily. Allergies   Yes [provider]  empagliflozin (JARDIANCE) 25 MG TABS tablet Take 25 mg by mouth daily.   Yes [provider]  glipiZIDE (GLUCOTROL XL) 10 MG 24 hr tablet Take 10 mg by mouth daily. 06/19/19  Yes [provider]  golimumab (SIMPONI ARIA) 50 MG/4ML SOLN injection 2mg /kg Intravenous 0,4 then every 8 weeks   Yes [provider]  hyoscyamine (LEVBID) 0.375 MG 12 hr tablet Take 1 tablet (0.375 mg total) by mouth 2 (two) times daily. 10/16/22  Yes Jaiyden Laur, Gerrit Friends, MD  leflunomide (ARAVA) 20 MG tablet Take 20 mg by mouth daily. 11/14/20  Yes [provider]  lisinopril (ZESTRIL) 10 MG tablet Take 10 mg by mouth daily. 06/23/19  Yes [provider]  metFORMIN (GLUCOPHAGE) 1000 MG tablet Take 1,000 mg by mouth 2 (two) times daily. 06/23/19  Yes [provider]  Multiple Vitamin (MULTIVITAMIN WITH MINERALS) TABS tablet Take 1 tablet by mouth every evening.   Yes [provider]  pantoprazole (PROTONIX) 40 MG tablet TAKE 1 TABLET BY MOUTH  DAILY BEFORE BREAKFAST 03/14/21  Yes Gelene Mink, NP   pioglitazone (ACTOS) 30 MG tablet Take 30 mg by mouth daily. 11/04/20  Yes [provider]  tamsulosin (FLOMAX) 0.4 MG CAPS capsule Take 0.4 mg by mouth at bedtime.   Yes [provider]  TRULICITY 0.75 MG/0.5ML SOPN Inject 0.75 mg into the skin once a week. 12/27/22  Yes [provider]    Allergies as of 01/15/2023 - Review Complete 01/15/2023  Allergen Reaction Noted   Fish allergy Anaphylaxis, Swelling, and Other (See Comments) 10/08/2012    Family History  Problem Relation Age of Onset   Colon cancer Maternal Grandmother    Colon polyps Mother    Diabetes Mother    Hypertension Mother    Hyperlipidemia Mother    Diabetes Father     Social History   Socioeconomic History   Marital status: Married    Spouse name: Not on file   Number of children: Not on file   Years of education: Not on file   Highest education level: Not on file  Occupational History   Occupation: Training and development officer: [employer]  Tobacco Use   Smoking status: Never   Smokeless tobacco: Never  Vaping Use   Vaping Use: Never used  Substance and Sexual Activity   Alcohol use: No   Drug use: No   Sexual activity: Yes  Other Topics Concern   Not on file  Social History Narrative   Not on file   Social Determinants of Health   Financial Resource Strain: Low Risk  (05/02/2022)   Overall Financial Resource Strain (CARDIA)    Difficulty of Paying Living Expenses: Not hard at all  Food Insecurity: No Food Insecurity (05/02/2022)   Hunger Vital Sign    Worried About Running Out of Food in the Last Year: Never true    Ran Out of Food in the Last Year: Never true  Transportation Needs: No Transportation Needs (05/02/2022)   PRAPARE - Administrator, Civil Service (Medical): No    Lack of Transportation (Non-Medical): No  Physical Activity: Not on file  Stress: Not on file  Social Connections: Not on file  Intimate Partner Violence: Not on file    Review  of Systems: See HPI, otherwise negative ROS  Physical Exam: BP 133/86 (BP Location: Left Arm, Patient Position: Sitting, Cuff Size: Large)   Pulse 72   Temp 97.9 F (36.6 C) (Oral)   Ht 6' (1.829 m)   Wt 252 lb (114.3 kg)   SpO2 96%   BMI 34.18 kg/m  General:   Alert,  Well-developed, well-nourished, pleasant and cooperative in NAD  Impression/Plan: 62 year old gentleman with GERD well-controlled on pantoprazole.  No alarm symptoms.  Postprandial abdominal cramps diarrhea consistent with IBS-D now much improved on Levbid and cessation of fiber use.  Inflammatory markers negative celiac screen negative.  Major health issue now is bulging disc lower back.  History of colonic adenoma; due for surveillance colonoscopy 2025.  Recommendations:   Continue Protonix 40 mg daily-best  taken 30 minutes before breakfast  Continue Levbid twice daily  Will plan to see you in 1 year to set up a surveillance colonoscopy  In the interim if you have any problems please do not hesitate to call me. Notice: This dictation was prepared with Dragon dictation along with smaller phrase technology. Any transcriptional errors that result from this process are unintentional and may not be corrected upon review.

## 2023-01-15 NOTE — Patient Instructions (Signed)
Good to see you again today!  Continue Protonix 40 mg daily-best taken 30 minutes before breakfast  Continue Levbid twice daily  Will plan to see you in 1 year to set up a surveillance colonoscopy  In the interim if you have any problems please do not hesitate to call me.

## 2023-01-18 DIAGNOSIS — M5416 Radiculopathy, lumbar region: Secondary | ICD-10-CM | POA: Diagnosis not present

## 2023-01-18 DIAGNOSIS — G894 Chronic pain syndrome: Secondary | ICD-10-CM | POA: Diagnosis not present

## 2023-01-18 DIAGNOSIS — M62830 Muscle spasm of back: Secondary | ICD-10-CM | POA: Diagnosis not present

## 2023-01-18 DIAGNOSIS — L4052 Psoriatic arthritis mutilans: Secondary | ICD-10-CM | POA: Diagnosis not present

## 2023-01-18 DIAGNOSIS — M47816 Spondylosis without myelopathy or radiculopathy, lumbar region: Secondary | ICD-10-CM | POA: Diagnosis not present

## 2023-02-05 DIAGNOSIS — L4050 Arthropathic psoriasis, unspecified: Secondary | ICD-10-CM | POA: Diagnosis not present

## 2023-02-05 DIAGNOSIS — Z79899 Other long term (current) drug therapy: Secondary | ICD-10-CM | POA: Diagnosis not present

## 2023-02-05 DIAGNOSIS — M456 Ankylosing spondylitis lumbar region: Secondary | ICD-10-CM | POA: Diagnosis not present

## 2023-02-06 ENCOUNTER — Other Ambulatory Visit: Payer: Self-pay | Admitting: Internal Medicine

## 2023-03-06 DIAGNOSIS — L4052 Psoriatic arthritis mutilans: Secondary | ICD-10-CM | POA: Diagnosis not present

## 2023-03-06 DIAGNOSIS — M47816 Spondylosis without myelopathy or radiculopathy, lumbar region: Secondary | ICD-10-CM | POA: Diagnosis not present

## 2023-03-06 DIAGNOSIS — M62830 Muscle spasm of back: Secondary | ICD-10-CM | POA: Diagnosis not present

## 2023-03-06 DIAGNOSIS — G894 Chronic pain syndrome: Secondary | ICD-10-CM | POA: Diagnosis not present

## 2023-04-02 DIAGNOSIS — L4050 Arthropathic psoriasis, unspecified: Secondary | ICD-10-CM | POA: Diagnosis not present

## 2023-04-02 DIAGNOSIS — M456 Ankylosing spondylitis lumbar region: Secondary | ICD-10-CM | POA: Diagnosis not present

## 2023-04-02 DIAGNOSIS — Z79899 Other long term (current) drug therapy: Secondary | ICD-10-CM | POA: Diagnosis not present

## 2023-04-09 DIAGNOSIS — E118 Type 2 diabetes mellitus with unspecified complications: Secondary | ICD-10-CM | POA: Diagnosis not present

## 2023-04-16 DIAGNOSIS — E1169 Type 2 diabetes mellitus with other specified complication: Secondary | ICD-10-CM | POA: Diagnosis not present

## 2023-04-16 DIAGNOSIS — I10 Essential (primary) hypertension: Secondary | ICD-10-CM | POA: Diagnosis not present

## 2023-04-17 DIAGNOSIS — M62830 Muscle spasm of back: Secondary | ICD-10-CM | POA: Diagnosis not present

## 2023-04-17 DIAGNOSIS — M47816 Spondylosis without myelopathy or radiculopathy, lumbar region: Secondary | ICD-10-CM | POA: Diagnosis not present

## 2023-04-17 DIAGNOSIS — L4052 Psoriatic arthritis mutilans: Secondary | ICD-10-CM | POA: Diagnosis not present

## 2023-04-17 DIAGNOSIS — G894 Chronic pain syndrome: Secondary | ICD-10-CM | POA: Diagnosis not present

## 2023-05-06 ENCOUNTER — Other Ambulatory Visit: Payer: Self-pay | Admitting: Internal Medicine

## 2023-05-06 DIAGNOSIS — L4050 Arthropathic psoriasis, unspecified: Secondary | ICD-10-CM | POA: Diagnosis not present

## 2023-05-06 DIAGNOSIS — M456 Ankylosing spondylitis lumbar region: Secondary | ICD-10-CM | POA: Diagnosis not present

## 2023-05-06 DIAGNOSIS — M1991 Primary osteoarthritis, unspecified site: Secondary | ICD-10-CM | POA: Diagnosis not present

## 2023-05-06 DIAGNOSIS — L409 Psoriasis, unspecified: Secondary | ICD-10-CM | POA: Diagnosis not present

## 2023-05-06 NOTE — Telephone Encounter (Signed)
 Routing to you in Dr. Luvenia Starch absence.

## 2023-05-07 ENCOUNTER — Other Ambulatory Visit: Payer: Self-pay | Admitting: Internal Medicine

## 2023-05-28 DIAGNOSIS — L4050 Arthropathic psoriasis, unspecified: Secondary | ICD-10-CM | POA: Diagnosis not present

## 2023-05-28 DIAGNOSIS — M456 Ankylosing spondylitis lumbar region: Secondary | ICD-10-CM | POA: Diagnosis not present

## 2023-06-11 DIAGNOSIS — M456 Ankylosing spondylitis lumbar region: Secondary | ICD-10-CM | POA: Diagnosis not present

## 2023-06-11 DIAGNOSIS — L4050 Arthropathic psoriasis, unspecified: Secondary | ICD-10-CM | POA: Diagnosis not present

## 2023-06-25 DIAGNOSIS — L4050 Arthropathic psoriasis, unspecified: Secondary | ICD-10-CM | POA: Diagnosis not present

## 2023-06-25 DIAGNOSIS — M456 Ankylosing spondylitis lumbar region: Secondary | ICD-10-CM | POA: Diagnosis not present

## 2023-07-16 DIAGNOSIS — E118 Type 2 diabetes mellitus with unspecified complications: Secondary | ICD-10-CM | POA: Diagnosis not present

## 2023-07-22 DIAGNOSIS — L4052 Psoriatic arthritis mutilans: Secondary | ICD-10-CM | POA: Diagnosis not present

## 2023-07-22 DIAGNOSIS — G894 Chronic pain syndrome: Secondary | ICD-10-CM | POA: Diagnosis not present

## 2023-07-22 DIAGNOSIS — M62830 Muscle spasm of back: Secondary | ICD-10-CM | POA: Diagnosis not present

## 2023-07-22 DIAGNOSIS — M47816 Spondylosis without myelopathy or radiculopathy, lumbar region: Secondary | ICD-10-CM | POA: Diagnosis not present

## 2023-07-23 DIAGNOSIS — E1169 Type 2 diabetes mellitus with other specified complication: Secondary | ICD-10-CM | POA: Diagnosis not present

## 2023-07-23 DIAGNOSIS — L4050 Arthropathic psoriasis, unspecified: Secondary | ICD-10-CM | POA: Diagnosis not present

## 2023-07-23 DIAGNOSIS — I10 Essential (primary) hypertension: Secondary | ICD-10-CM | POA: Diagnosis not present

## 2023-07-23 DIAGNOSIS — M456 Ankylosing spondylitis lumbar region: Secondary | ICD-10-CM | POA: Diagnosis not present

## 2023-08-06 ENCOUNTER — Other Ambulatory Visit: Payer: Self-pay | Admitting: Internal Medicine

## 2023-08-20 DIAGNOSIS — L4050 Arthropathic psoriasis, unspecified: Secondary | ICD-10-CM | POA: Diagnosis not present

## 2023-08-20 DIAGNOSIS — M456 Ankylosing spondylitis lumbar region: Secondary | ICD-10-CM | POA: Diagnosis not present

## 2023-09-17 DIAGNOSIS — L4050 Arthropathic psoriasis, unspecified: Secondary | ICD-10-CM | POA: Diagnosis not present

## 2023-09-17 DIAGNOSIS — M456 Ankylosing spondylitis lumbar region: Secondary | ICD-10-CM | POA: Diagnosis not present

## 2023-10-01 DIAGNOSIS — L4050 Arthropathic psoriasis, unspecified: Secondary | ICD-10-CM | POA: Diagnosis not present

## 2023-10-01 DIAGNOSIS — M1991 Primary osteoarthritis, unspecified site: Secondary | ICD-10-CM | POA: Diagnosis not present

## 2023-10-01 DIAGNOSIS — Z79899 Other long term (current) drug therapy: Secondary | ICD-10-CM | POA: Diagnosis not present

## 2023-10-01 DIAGNOSIS — L409 Psoriasis, unspecified: Secondary | ICD-10-CM | POA: Diagnosis not present

## 2023-10-01 DIAGNOSIS — R5383 Other fatigue: Secondary | ICD-10-CM | POA: Diagnosis not present

## 2023-10-01 DIAGNOSIS — M456 Ankylosing spondylitis lumbar region: Secondary | ICD-10-CM | POA: Diagnosis not present

## 2023-10-15 DIAGNOSIS — M47816 Spondylosis without myelopathy or radiculopathy, lumbar region: Secondary | ICD-10-CM | POA: Diagnosis not present

## 2023-10-15 DIAGNOSIS — L4050 Arthropathic psoriasis, unspecified: Secondary | ICD-10-CM | POA: Diagnosis not present

## 2023-10-15 DIAGNOSIS — M62830 Muscle spasm of back: Secondary | ICD-10-CM | POA: Diagnosis not present

## 2023-10-15 DIAGNOSIS — L4052 Psoriatic arthritis mutilans: Secondary | ICD-10-CM | POA: Diagnosis not present

## 2023-10-15 DIAGNOSIS — G894 Chronic pain syndrome: Secondary | ICD-10-CM | POA: Diagnosis not present

## 2023-10-15 DIAGNOSIS — M456 Ankylosing spondylitis lumbar region: Secondary | ICD-10-CM | POA: Diagnosis not present

## 2023-11-06 ENCOUNTER — Other Ambulatory Visit: Payer: Self-pay | Admitting: Internal Medicine

## 2023-11-13 DIAGNOSIS — L4050 Arthropathic psoriasis, unspecified: Secondary | ICD-10-CM | POA: Diagnosis not present

## 2023-11-13 DIAGNOSIS — M456 Ankylosing spondylitis lumbar region: Secondary | ICD-10-CM | POA: Diagnosis not present

## 2023-11-27 DIAGNOSIS — N401 Enlarged prostate with lower urinary tract symptoms: Secondary | ICD-10-CM | POA: Diagnosis not present

## 2023-11-27 DIAGNOSIS — R350 Frequency of micturition: Secondary | ICD-10-CM | POA: Diagnosis not present

## 2023-11-27 DIAGNOSIS — R3912 Poor urinary stream: Secondary | ICD-10-CM | POA: Diagnosis not present

## 2023-11-27 DIAGNOSIS — R351 Nocturia: Secondary | ICD-10-CM | POA: Diagnosis not present

## 2023-11-27 DIAGNOSIS — R3915 Urgency of urination: Secondary | ICD-10-CM | POA: Diagnosis not present

## 2023-12-05 ENCOUNTER — Telehealth: Payer: Self-pay

## 2023-12-05 NOTE — Progress Notes (Signed)
   12/05/2023  Patient ID: Steve Mccormick, male   DOB: 12/08/1960, 63 y.o.   MRN: [id number]   Patient appeared on insurance report for not passing the quality metrics in 2024:  Medication Adherence for Hypertension [facility])  - however it appears per fill history in Epic, he should not have failed this.   Outreach to the patient was not needed today.  Meds Tracking:  -Lisinopril 10 mg - Last fill 90DS on 09/12/23, BP 128/74 on 07/23/23. Does not qualify for metric yet, next fill due 12/11/23  -Trulicity 1.5 mg - Last fill 28DS on 11/26/23, A1C 7.0 on 07/17/23. Qualifies for metric this year, next fill due 12/24/23 -Jardiance 25 mg - Last fill 90Ds on 10/12/23, next fill due 01/10/24 -Glipizide ER 10 mg - Last fill 90DS on 09/12/23, next fill due 12/11/23 -Metformin 1000 mg - Last fill 90DS on 11/13/23, next fill due 02/11/24 -Pioglitazone 30 mg - Last fill 90DS on 10/14/23, next fill due 01/12/24  Plan:  Appeared adherent all last year, unsure why he was on the 2024 fail report for [facility]. Will still follow adherence. No statin use due to myalgias. Will need to make sure G72.9 is billed at some point this year. Will schedule fill history review on 12/17/23.  Fayette Pho, PharmD

## 2023-12-11 DIAGNOSIS — L4050 Arthropathic psoriasis, unspecified: Secondary | ICD-10-CM | POA: Diagnosis not present

## 2023-12-11 DIAGNOSIS — M456 Ankylosing spondylitis lumbar region: Secondary | ICD-10-CM | POA: Diagnosis not present

## 2023-12-17 ENCOUNTER — Telehealth: Payer: Self-pay

## 2023-12-17 NOTE — Progress Notes (Signed)
   12/17/2023  Patient ID: Steve Mccormick, male   DOB: 09-Dec-1960, 63 y.o.   MRN: [id number]   Patient appeared on insurance report for not passing the quality metrics in 2024:  Medication Adherence for Hypertension [facility])    Outreach to the patient was not needed today.  Meds Tracking:  -Lisinopril 10 mg - Last fill 90DS on 12/08/23, BP 128/74 on 07/23/23. Now qualifies for metric yet, next fill due 03/10/24  -Trulicity 1.5 mg - Last fill 28DS on 11/26/23, A1C 7.0 on 07/17/23. Qualifies for metric this year, next fill due 12/24/23 -Jardiance 25 mg - Last fill 90Ds on 10/12/23, next fill due 01/10/24 -Glipizide ER 10 mg - Last fill 90DS on 12/08/23, next fill due 03/10/24 -Metformin 1000 mg - Last fill 90DS on 11/13/23, next fill due 02/11/24 -Pioglitazone 30 mg - Last fill 90DS on 10/14/23, next fill due 01/12/24  Plan:  Appeared adherent all last year, unsure why he was on the 2024 fail report for [facility]. Will still follow adherence. No statin use due to myalgias. Will need to make sure G72.9 is billed at some point this year. Note left to provider to include with annual physical on 01/13/24.  Fayette Pho, PharmD

## 2023-12-19 ENCOUNTER — Encounter: Payer: Self-pay | Admitting: Internal Medicine

## 2023-12-26 ENCOUNTER — Telehealth: Payer: Self-pay

## 2023-12-26 NOTE — Progress Notes (Signed)
   12/26/2023  Patient ID: Steve Mccormick, male   DOB: October 28, 1960, 63 y.o.   MRN: [id number]   Patient appeared on insurance report for not passing the quality metrics in 2024:  Medication Adherence for Hypertension [facility])    Outreach to the patient was not needed today.  Meds Tracking:  -Lisinopril 10 mg - Last fill 90DS on 12/08/23, BP 128/74 on 07/23/23. Now qualifies for metric yet, next fill due 03/10/24  -Trulicity 1.5 mg - Last fill 28DS on 12/23/23, A1C 7.0 on 07/17/23. Qualifies for metric this year, next fill due 01/20/24 -Jardiance 25 mg - Last fill 90Ds on 10/12/23, next fill due 01/10/24 -Glipizide ER 10 mg - Last fill 90DS on 12/08/23, next fill due 03/10/24 -Metformin 1000 mg - Last fill 90DS on 11/13/23, next fill due 02/11/24 -Pioglitazone 30 mg - Last fill 90DS on 10/14/23, next fill due 01/12/24  Plan:  Review fill history for Jardiance, pioglitazone and any changes that occurred at PCP follow up on 01/14/24.   Flint Hummer, PharmD

## 2024-01-06 DIAGNOSIS — N400 Enlarged prostate without lower urinary tract symptoms: Secondary | ICD-10-CM | POA: Diagnosis not present

## 2024-01-06 DIAGNOSIS — I10 Essential (primary) hypertension: Secondary | ICD-10-CM | POA: Diagnosis not present

## 2024-01-06 DIAGNOSIS — Z79899 Other long term (current) drug therapy: Secondary | ICD-10-CM | POA: Diagnosis not present

## 2024-01-06 DIAGNOSIS — T466X5A Adverse effect of antihyperlipidemic and antiarteriosclerotic drugs, initial encounter: Secondary | ICD-10-CM | POA: Diagnosis not present

## 2024-01-06 DIAGNOSIS — Z125 Encounter for screening for malignant neoplasm of prostate: Secondary | ICD-10-CM | POA: Diagnosis not present

## 2024-01-06 DIAGNOSIS — E118 Type 2 diabetes mellitus with unspecified complications: Secondary | ICD-10-CM | POA: Diagnosis not present

## 2024-01-06 DIAGNOSIS — L4052 Psoriatic arthritis mutilans: Secondary | ICD-10-CM | POA: Diagnosis not present

## 2024-01-08 DIAGNOSIS — M456 Ankylosing spondylitis lumbar region: Secondary | ICD-10-CM | POA: Diagnosis not present

## 2024-01-08 DIAGNOSIS — Z79899 Other long term (current) drug therapy: Secondary | ICD-10-CM | POA: Diagnosis not present

## 2024-01-08 DIAGNOSIS — L4050 Arthropathic psoriasis, unspecified: Secondary | ICD-10-CM | POA: Diagnosis not present

## 2024-01-14 ENCOUNTER — Telehealth: Payer: Self-pay

## 2024-01-14 NOTE — Progress Notes (Signed)
   01/14/2024  Patient ID: Steve Mccormick, male   DOB: [birth year], 63 y.o.   MRN: [id number]  Adherence Monitoring  Up to date on all meds, no outreach needed today. Documentation in [employer].   Flint Hummer, PharmD

## 2024-01-21 DIAGNOSIS — R350 Frequency of micturition: Secondary | ICD-10-CM | POA: Diagnosis not present

## 2024-01-21 DIAGNOSIS — R3915 Urgency of urination: Secondary | ICD-10-CM | POA: Diagnosis not present

## 2024-01-21 DIAGNOSIS — N401 Enlarged prostate with lower urinary tract symptoms: Secondary | ICD-10-CM | POA: Diagnosis not present

## 2024-01-21 DIAGNOSIS — R351 Nocturia: Secondary | ICD-10-CM | POA: Diagnosis not present

## 2024-01-21 DIAGNOSIS — R3912 Poor urinary stream: Secondary | ICD-10-CM | POA: Diagnosis not present

## 2024-01-23 ENCOUNTER — Telehealth: Payer: Self-pay

## 2024-01-23 NOTE — Progress Notes (Signed)
   01/23/2024  Patient ID: Steve Mccormick, male   DOB: 29-Dec-1960, 63 y.o.   MRN: [id number]  Adherence Monitoring:  Up to date on all meds, documentation in [employer], next review in July   Flint Hummer, PharmD

## 2024-02-05 DIAGNOSIS — L4050 Arthropathic psoriasis, unspecified: Secondary | ICD-10-CM | POA: Diagnosis not present

## 2024-02-05 DIAGNOSIS — M456 Ankylosing spondylitis lumbar region: Secondary | ICD-10-CM | POA: Diagnosis not present

## 2024-02-10 DIAGNOSIS — L4052 Psoriatic arthritis mutilans: Secondary | ICD-10-CM | POA: Diagnosis not present

## 2024-02-10 DIAGNOSIS — G894 Chronic pain syndrome: Secondary | ICD-10-CM | POA: Diagnosis not present

## 2024-02-10 DIAGNOSIS — M47816 Spondylosis without myelopathy or radiculopathy, lumbar region: Secondary | ICD-10-CM | POA: Diagnosis not present

## 2024-02-10 DIAGNOSIS — M62830 Muscle spasm of back: Secondary | ICD-10-CM | POA: Diagnosis not present

## 2024-03-04 DIAGNOSIS — L4050 Arthropathic psoriasis, unspecified: Secondary | ICD-10-CM | POA: Diagnosis not present

## 2024-03-04 DIAGNOSIS — M456 Ankylosing spondylitis lumbar region: Secondary | ICD-10-CM | POA: Diagnosis not present

## 2024-03-17 ENCOUNTER — Telehealth: Payer: Self-pay

## 2024-03-17 NOTE — Telephone Encounter (Signed)
 Up to date on meds, next review in October

## 2024-03-20 DIAGNOSIS — R3915 Urgency of urination: Secondary | ICD-10-CM | POA: Diagnosis not present

## 2024-03-20 DIAGNOSIS — R350 Frequency of micturition: Secondary | ICD-10-CM | POA: Diagnosis not present

## 2024-03-20 DIAGNOSIS — N401 Enlarged prostate with lower urinary tract symptoms: Secondary | ICD-10-CM | POA: Diagnosis not present

## 2024-03-20 DIAGNOSIS — R3912 Poor urinary stream: Secondary | ICD-10-CM | POA: Diagnosis not present

## 2024-03-24 ENCOUNTER — Other Ambulatory Visit: Payer: Self-pay | Admitting: Gastroenterology

## 2024-03-24 ENCOUNTER — Telehealth (INDEPENDENT_AMBULATORY_CARE_PROVIDER_SITE_OTHER): Payer: Self-pay | Admitting: *Deleted

## 2024-03-24 DIAGNOSIS — K580 Irritable bowel syndrome with diarrhea: Secondary | ICD-10-CM

## 2024-03-24 DIAGNOSIS — M791 Myalgia, unspecified site: Secondary | ICD-10-CM | POA: Diagnosis not present

## 2024-03-24 DIAGNOSIS — I10 Essential (primary) hypertension: Secondary | ICD-10-CM | POA: Diagnosis not present

## 2024-03-24 DIAGNOSIS — Z0001 Encounter for general adult medical examination with abnormal findings: Secondary | ICD-10-CM | POA: Diagnosis not present

## 2024-03-24 DIAGNOSIS — L4050 Arthropathic psoriasis, unspecified: Secondary | ICD-10-CM | POA: Diagnosis not present

## 2024-03-24 MED ORDER — HYOSCYAMINE SULFATE ER 0.375 MG PO TB12
0.3750 mg | ORAL_TABLET | Freq: Two times a day (BID) | ORAL | 0 refills | Status: DC
Start: 2024-03-24 — End: 2024-05-05

## 2024-03-24 MED ORDER — HYOSCYAMINE SULFATE ER 0.375 MG PO TB12
0.3750 mg | ORAL_TABLET | Freq: Two times a day (BID) | ORAL | 0 refills | Status: DC
Start: 2024-03-24 — End: 2024-03-24

## 2024-03-24 NOTE — Telephone Encounter (Signed)
 Noted

## 2024-03-24 NOTE — Telephone Encounter (Signed)
 Voice mail today at 9:18 am  He needs a RX for his hyoscyamine  .0375 mg to [facility] [address] in [place].  This is a new pharmacy for him.   Last visit 01/15/2023--past due for follow-up and TCS    [phone]--home phone   Ladonna please call patient and let him know he needs OV for refills --give him an appt. And then see if we if they can refill meds until that visit.  Make sure to document when scheduled and forward to Charmaine RAMAN and Dena

## 2024-03-31 DIAGNOSIS — L4050 Arthropathic psoriasis, unspecified: Secondary | ICD-10-CM | POA: Diagnosis not present

## 2024-03-31 DIAGNOSIS — L409 Psoriasis, unspecified: Secondary | ICD-10-CM | POA: Diagnosis not present

## 2024-03-31 DIAGNOSIS — M1991 Primary osteoarthritis, unspecified site: Secondary | ICD-10-CM | POA: Diagnosis not present

## 2024-03-31 DIAGNOSIS — M456 Ankylosing spondylitis lumbar region: Secondary | ICD-10-CM | POA: Diagnosis not present

## 2024-04-14 ENCOUNTER — Ambulatory Visit: Admitting: Internal Medicine

## 2024-04-14 ENCOUNTER — Other Ambulatory Visit: Payer: Self-pay | Admitting: Medical Genetics

## 2024-04-15 ENCOUNTER — Other Ambulatory Visit (HOSPITAL_COMMUNITY)
Admission: RE | Admit: 2024-04-15 | Discharge: 2024-04-15 | Disposition: A | Discharge status: Home / Self Care | Payer: Self-pay | Source: Ambulatory Visit | Attending: Oncology | Admitting: Oncology

## 2024-04-26 LAB — GENECONNECT MOLECULAR SCREEN: Genetic Analysis Overall Interpretation: NEGATIVE

## 2024-05-05 ENCOUNTER — Ambulatory Visit: Admitting: Internal Medicine

## 2024-05-05 ENCOUNTER — Encounter: Payer: Self-pay | Admitting: Internal Medicine

## 2024-05-05 VITALS — BP 135/83 | HR 77 | Temp 97.9°F | Ht 72.0 in | Wt 249.4 lb

## 2024-05-05 DIAGNOSIS — K580 Irritable bowel syndrome with diarrhea: Secondary | ICD-10-CM

## 2024-05-05 DIAGNOSIS — Z800 Family history of malignant neoplasm of digestive organs: Secondary | ICD-10-CM | POA: Diagnosis not present

## 2024-05-05 DIAGNOSIS — K219 Gastro-esophageal reflux disease without esophagitis: Secondary | ICD-10-CM | POA: Diagnosis not present

## 2024-05-05 MED ORDER — HYOSCYAMINE SULFATE ER 0.375 MG PO TB12
0.3750 mg | ORAL_TABLET | Freq: Two times a day (BID) | ORAL | 3 refills | Status: AC
Start: 2024-05-05 — End: ?

## 2024-05-05 NOTE — Progress Notes (Signed)
 Primary Care Physician:  Sheryle Carwin, MD Primary Gastroenterologist:  Dr. Shaaron  Pre-Procedure History & Physical: HPI:  Steve Mccormick is a 63 y.o. male here for follow-up of GERD and family history colon polyps, intermittent diarrhea.  Overall reflux well-controlled no alarm symptoms denies dysphagia.  Has about 3-4 episodes of diarrhea out of a month taking Levbid  twice daily.  The remainder of the time, patient has normal bowel function.  He takes Levbid  1-2 times daily on a regular basis.  Grandmother with colon cancer mother with colon polyps at young age.  Diverticulosis only found in 2020.  He is due for high risk screening colonoscopy now.  He tells me diabetes improved with a hemoglobin A1c down to 6.9 recently.  He follows regularly with Dr. Sheryle.  Past Medical History:  Diagnosis Date   Diabetes mellitus    Fatty liver    GERD (gastroesophageal reflux disease)    Hypertension    Psoriatic arthritis (HCC)    psoriatic and RA   Tinnitus    Wears hearing aid     Past Surgical History:  Procedure Laterality Date   ANKLE SURGERY     bilateral   ANTERIOR CERVICAL DECOMPRESSION/DISCECTOMY FUSION 4 LEVELS N/A 01/05/2021   Procedure: ANTERIOR CERVICAL DECOMPRESSION FUSION CERVICAL 3- CERVICAL4, CERVICAL 4- CERVICAL 5, CERVICAL, CERVICAL 5- CERVICAL 6 WITH INSTRUMENTATION AND ALLOGRAFT;  Surgeon: Beuford Anes, MD;  Location: [facility];  Service: Orthopedics;  Laterality: N/A;   BIOPSY  08/13/2019   Procedure: BIOPSY;  Surgeon: Shaaron Lamar HERO, MD;  [facility];  Service: Endoscopy;;  gastric esophageal   COLONOSCOPY N/A 10/22/2012   Dr. Shaaron: colonic diverticulosis. next TCS in 5 years due to [facility] CRC, colon polyps   COLONOSCOPY WITH PROPOFOL  N/A 08/13/2019   Dr. Shaaron: diverticulosis, long redundant colon. due to personal history of polyps, next TCS in five years   ESOPHAGOGASTRODUODENOSCOPY (EGD) WITH PROPOFOL  N/A 08/13/2019   Dr. Shaaron: 1cm salmon colored epithelium  in distal esophagus (bx benign with no Barrett's), benign gastric polyp.   Ileocolonoscopy  10/02/2007   RMR: Friable anal canal, otherwise normal rectum.  Left-sided diverticula.  The colonic mucosa and  terminal ileum mucosa appeared normal rectum, left-sided diverticula   KNEE SURGERY     bilateral   plantar fasciitis Bilateral    SHOULDER SURGERY     bilateral   TOTAL HIP ARTHROPLASTY Left 09/01/2019   Procedure: TOTAL HIP ARTHROPLASTY ANTERIOR APPROACH;  Surgeon: Beverley Evalene BIRCH, MD;  Location: [facility];  Service: Orthopedics;  Laterality: Left;    Prior to Admission medications   Medication Sig Start Date End Date Taking? Authorizing Provider  canagliflozin  (INVOKANA ) 300 MG TABS tablet Take 300 mg by mouth daily before breakfast.   Yes [provider]  celecoxib  (CELEBREX ) 200 MG capsule Take 200 mg by mouth 2 (two) times daily as needed. 04/26/22  Yes [provider]  certolizumab pegol  (CIMZIA ) 2 X 200 MG KIT Inject 400 mg into the skin every 30 (thirty) days.   Yes [provider]  cetirizine (ZYRTEC) 10 MG tablet Take 10 mg by mouth daily. Allergies   Yes [provider]  empagliflozin  (JARDIANCE ) 25 MG TABS tablet Take 25 mg by mouth daily.   Yes [provider]  glipiZIDE  (GLUCOTROL  XL) 10 MG 24 hr tablet Take 10 mg by mouth daily. 06/19/19  Yes [provider]  HYDROcodone -acetaminophen  (NORCO) 10-325 MG tablet Take 1 tablet by mouth every 6 (six)  hours as needed.   Yes [provider]  hyoscyamine  (LEVBID ) 0.375 MG 12 hr tablet Take 1 tablet (0.375 mg total) by mouth 2 (two) times daily. 03/24/24  Yes Mahon, Charmaine CROME, NP  leflunomide  (ARAVA ) 20 MG tablet Take 20 mg by mouth daily. 11/14/20  Yes [provider]  lisinopril  (ZESTRIL ) 10 MG tablet Take 10 mg by mouth daily. 06/23/19  Yes [provider]  metFORMIN  (GLUCOPHAGE ) 1000 MG tablet Take 1,000 mg by mouth 2 (two) times daily. 06/23/19  Yes  [provider]  methocarbamol  (ROBAXIN ) 750 MG tablet Take 750 mg by mouth every 6 (six) hours as needed.   Yes [provider]  pantoprazole  (PROTONIX ) 40 MG tablet TAKE 1 TABLET BY MOUTH  DAILY BEFORE BREAKFAST 03/14/21  Yes Shirlean Therisa ORN, NP  pioglitazone  (ACTOS ) 30 MG tablet Take 30 mg by mouth daily. 11/04/20  Yes [provider]  tamsulosin  (FLOMAX ) 0.4 MG CAPS capsule Take 0.4 mg by mouth at bedtime.   Yes [provider]  TRULICITY 1.5 MG/0.5ML SOAJ Inject 1.5 mg into the skin once a week. 04/16/24  Yes [provider]    Allergies as of 05/05/2024 - Review Complete 05/05/2024  Allergen Reaction Noted   Fish allergy Anaphylaxis, Swelling, and Other (See Comments) 10/08/2012    Family History  Problem Relation Age of Onset   Colon cancer Maternal Grandmother    Colon polyps Mother    Diabetes Mother    Hypertension Mother    Hyperlipidemia Mother    Diabetes Father     Social History   Socioeconomic History   Marital status: Married    Spouse name: Not on file   Number of children: Not on file   Years of education: Not on file   Highest education level: Not on file  Occupational History   Occupation: Training and development officer: [employer]  Tobacco Use   Smoking status: Never   Smokeless tobacco: Never  Vaping Use   Vaping status: Never Used  Substance and Sexual Activity   Alcohol use: No   Drug use: No   Sexual activity: Yes  Other Topics Concern   Not on file  Social History Narrative   Not on file   Social Drivers of Health   Financial Resource Strain: Low Risk  (05/02/2022)   Overall Financial Resource Strain (CARDIA)    Difficulty of Paying Living Expenses: Not hard at all  Food Insecurity: No Food Insecurity (05/02/2022)   Hunger Vital Sign    Worried About Running Out of Food in the Last Year: Never true    Ran Out of Food in the Last Year: Never true  Transportation Needs: No Transportation Needs  (05/02/2022)   PRAPARE - Administrator, Civil Service (Medical): No    Lack of Transportation (Non-Medical): No  Physical Activity: Not on file  Stress: Not on file  Social Connections: Not on file  Intimate Partner Violence: Not on file    Review of Systems: See HPI, otherwise negative ROS  Physical Exam: BP 135/83 (BP Location: Right Arm, Patient Position: Sitting, Cuff Size: Large)   Pulse 77   Temp 97.9 F (36.6 C) (Oral)   Ht 6' (1.829 m)   Wt 249 lb 6.4 oz (113.1 kg)   SpO2 96%   BMI 33.82 kg/m  General:   Alert,  Well-developed, well-nourished, pleasant and cooperative in NAD Neck:  Supple; no masses or thyromegaly. No significant cervical adenopathy.  Lungs:  Clear throughout to auscultation.   No wheezes, crackles, or rhonchi. No acute distress. Heart:  Regular rate and rhythm; no murmurs, clicks, rubs,  or gallops. Abdomen: Non-distended, normal bowel sounds.  Soft and nontender without appreciable mass or hepatosplenomegaly.   Impression/Plan: 63 year old gentleman GERD well-controlled no alarm features no history of Barrett's on prior EGD.  Pantoprazole  once daily before breakfast.  Family history colon cancer secondary relative colon polyps in first-degree relative.  He is due for high rescreening colonoscopy this year.  Intermittent abdominal cramps and diarrhea most consistent with IBS.  Symptoms are relatively infrequent he has no symptoms most of the time likely IBS-D; diabetes associated medications for treatment (metformin ) may be contributing to his intermittent infrequent symptoms.  He is quite pleased with the improvement with Levbid   Recommendations:  Continue pantoprazole  Protonix  40 mg once daily best taken 30 minutes for breakfast.  New prescription for either 30 or 90 with 1 years refill on top of that  Continue Levbid  1 tablet twice daily as needed dispense 60 with 11 refills  As discussed with your positive family history of colon  polyps, is recommended you undergo a high risk screening colonoscopy later this year.  ASA 2.  The risks, benefits, limitations, alternatives and imponderables have been reviewed with the patient. Questions have been answered. All parties are agreeable.    Further recommendations to follow.    Notice: This dictation was prepared with Dragon dictation along with smaller phrase technology. Any transcriptional errors that result from this process are unintentional and may not be corrected upon review.

## 2024-05-05 NOTE — Patient Instructions (Signed)
 It was good to see you again today!  Continue pantoprazole  Protonix  40 mg once daily best taken 30 minutes for breakfast.  New prescription for either 30 or 90 with 1 years refill on top of that  Continue Levbid  1 tablet twice daily as needed dispense 60 with 11 refills  As discussed with your positive family history of colon polyps, is recommended you undergo a high risk screening colonoscopy later this year.  ASA 2  Further recommendations to follow.

## 2024-05-07 DIAGNOSIS — L4052 Psoriatic arthritis mutilans: Secondary | ICD-10-CM | POA: Diagnosis not present

## 2024-05-07 DIAGNOSIS — G894 Chronic pain syndrome: Secondary | ICD-10-CM | POA: Diagnosis not present

## 2024-05-07 DIAGNOSIS — M47816 Spondylosis without myelopathy or radiculopathy, lumbar region: Secondary | ICD-10-CM | POA: Diagnosis not present

## 2024-05-07 DIAGNOSIS — M62830 Muscle spasm of back: Secondary | ICD-10-CM | POA: Diagnosis not present

## 2024-05-20 ENCOUNTER — Other Ambulatory Visit: Payer: Self-pay | Admitting: *Deleted

## 2024-05-20 ENCOUNTER — Encounter: Payer: Self-pay | Admitting: *Deleted

## 2024-05-20 MED ORDER — PEG 3350-KCL-NA BICARB-NACL 420 G PO SOLR: 4000.0000 mL | Freq: Once | ORAL | 0 refills | Status: AC

## 2024-06-10 NOTE — Anesthesia Preprocedure Evaluation (Signed)
 Anesthesia Evaluation  Patient identified by MRN, date of birth, ID band Patient awake    Reviewed: Allergy & Precautions, H&P , NPO status , Patient's Chart, lab work & pertinent test results, reviewed documented beta blocker date and time   Airway Mallampati: II  TM Distance: >3 FB Neck ROM: full    Dental no notable dental hx. (+) Dental Advisory Given, Teeth Intact   Pulmonary neg pulmonary ROS   Pulmonary exam normal breath sounds clear to auscultation       Cardiovascular Exercise Tolerance: Good hypertension, Normal cardiovascular exam Rhythm:regular Rate:Normal     Neuro/Psych Raynauds. myeloradiculopathy negative neurological ROS  negative psych ROS   GI/Hepatic Neg liver ROS,GERD  ,,  Endo/Other  diabetes, Type 2    Renal/GU negative Renal ROS  negative genitourinary   Musculoskeletal  (+) Arthritis , Rheumatoid disorders,    Abdominal   Peds  Hematology negative hematology ROS (+)   Anesthesia Other Findings Psoriatic arthritis  Reproductive/Obstetrics negative OB ROS                              Anesthesia Physical Anesthesia Plan  ASA: 3  Anesthesia Plan: General   Post-op Pain Management: Minimal or no pain anticipated   Induction: Intravenous  PONV Risk Score and Plan: Propofol  infusion  Airway Management Planned: Natural Airway and Nasal Cannula  Additional Equipment: None  Intra-op Plan:   Post-operative Plan:   Informed Consent: I have reviewed the patients History and Physical, chart, labs and discussed the procedure including the risks, benefits and alternatives for the proposed anesthesia with the patient or authorized representative who has indicated his/her understanding and acceptance.     Dental Advisory Given  Plan Discussed with: CRNA  Anesthesia Plan Comments:         Anesthesia Quick Evaluation

## 2024-06-11 ENCOUNTER — Encounter (HOSPITAL_COMMUNITY): Payer: Self-pay | Admitting: Anesthesiology

## 2024-06-11 ENCOUNTER — Encounter (HOSPITAL_COMMUNITY): Admission: RE | Disposition: A | Payer: Self-pay | Source: Home / Self Care | Attending: Internal Medicine

## 2024-06-11 ENCOUNTER — Ambulatory Visit (HOSPITAL_COMMUNITY)
Admission: RE | Admit: 2024-06-11 | Discharge: 2024-06-11 | Disposition: A | Discharge status: Home / Self Care | Attending: Internal Medicine | Admitting: Internal Medicine

## 2024-06-11 ENCOUNTER — Other Ambulatory Visit: Payer: Self-pay

## 2024-06-11 ENCOUNTER — Ambulatory Visit (HOSPITAL_COMMUNITY): Payer: Self-pay | Admitting: Anesthesiology

## 2024-06-11 ENCOUNTER — Encounter (HOSPITAL_COMMUNITY): Payer: Self-pay | Admitting: Internal Medicine

## 2024-06-11 DIAGNOSIS — L4050 Arthropathic psoriasis, unspecified: Secondary | ICD-10-CM | POA: Diagnosis not present

## 2024-06-11 DIAGNOSIS — K5730 Diverticulosis of large intestine without perforation or abscess without bleeding: Secondary | ICD-10-CM | POA: Insufficient documentation

## 2024-06-11 DIAGNOSIS — Z7984 Long term (current) use of oral hypoglycemic drugs: Secondary | ICD-10-CM | POA: Insufficient documentation

## 2024-06-11 DIAGNOSIS — Z7985 Long-term (current) use of injectable non-insulin antidiabetic drugs: Secondary | ICD-10-CM | POA: Insufficient documentation

## 2024-06-11 DIAGNOSIS — Z860100 Personal history of colon polyps, unspecified: Secondary | ICD-10-CM | POA: Diagnosis not present

## 2024-06-11 DIAGNOSIS — Z1211 Encounter for screening for malignant neoplasm of colon: Secondary | ICD-10-CM | POA: Diagnosis not present

## 2024-06-11 DIAGNOSIS — Z83719 Family history of colon polyps, unspecified: Secondary | ICD-10-CM | POA: Diagnosis not present

## 2024-06-11 DIAGNOSIS — I10 Essential (primary) hypertension: Secondary | ICD-10-CM | POA: Insufficient documentation

## 2024-06-11 DIAGNOSIS — E119 Type 2 diabetes mellitus without complications: Secondary | ICD-10-CM | POA: Diagnosis not present

## 2024-06-11 DIAGNOSIS — K219 Gastro-esophageal reflux disease without esophagitis: Secondary | ICD-10-CM | POA: Insufficient documentation

## 2024-06-11 DIAGNOSIS — M069 Rheumatoid arthritis, unspecified: Secondary | ICD-10-CM | POA: Insufficient documentation

## 2024-06-11 DIAGNOSIS — I658 Occlusion and stenosis of other precerebral arteries: Secondary | ICD-10-CM | POA: Insufficient documentation

## 2024-06-11 HISTORY — PX: COLONOSCOPY: SHX5424

## 2024-06-11 LAB — GLUCOSE, CAPILLARY: Glucose-Capillary: 142 mg/dL — ABNORMAL HIGH (ref 70–99)

## 2024-06-11 SURGERY — COLONOSCOPY
Anesthesia: General

## 2024-06-11 MED ORDER — LIDOCAINE HCL (CARDIAC) PF 100 MG/5ML IV SOSY
PREFILLED_SYRINGE | INTRAVENOUS | Status: DC | PRN
  Administered 2024-06-11: 100 mg via INTRAVENOUS

## 2024-06-11 MED ORDER — PROPOFOL 10 MG/ML IV BOLUS
INTRAVENOUS | Status: DC | PRN
  Administered 2024-06-11: 100 ug/kg/min via INTRAVENOUS
  Administered 2024-06-11: 100 mg via INTRAVENOUS

## 2024-06-11 MED ORDER — LACTATED RINGERS IV SOLN: INTRAVENOUS | Status: DC

## 2024-06-11 NOTE — Transfer of Care (Signed)
 Immediate Anesthesia Transfer of Care Note  Patient: Steve Mccormick  Procedure(s) Performed: COLONOSCOPY  Patient Location: Endoscopy Unit  Anesthesia Type:General  Level of Consciousness: awake, alert , oriented, and patient cooperative  Airway & Oxygen Therapy: Patient Spontanous Breathing and Patient connected to nasal cannula oxygen  Post-op Assessment: Report given to RN and Post -op Vital signs reviewed and stable  Post vital signs: Reviewed and stable  Last Vitals:  Vitals Value Taken Time  BP 93/69 06/11/24 08:10  Temp 37.1 C 06/11/24 08:10  Pulse 74 06/11/24 08:10  Resp 16 06/11/24 08:10  SpO2 97 % 06/11/24 08:10    Last Pain:  Vitals:   06/11/24 0810  TempSrc: Oral  PainSc: 0-No pain      Patients Stated Pain Goal: 5 (06/11/24 9347)  Complications: No notable events documented.

## 2024-06-11 NOTE — H&P (Signed)
 @LOGO @   Gastroenterology Progress Note    Primary Care Physician:  Sheryle Carwin, MD Primary Gastroenterologist:  Dr. Shaaron  Pre-Procedure History & Physical: HPI:  Steve Mccormick is a 63 y.o. male here for here for high risk colonoscopy history of colonic polyps positive family history colonic polyps in first-degree relatives at a young age.  Past Medical History:  Diagnosis Date   Diabetes mellitus    Fatty liver    GERD (gastroesophageal reflux disease)    Hypertension    Psoriatic arthritis (HCC)    psoriatic and RA   Tinnitus    Wears hearing aid     Past Surgical History:  Procedure Laterality Date   ANKLE SURGERY     bilateral   ANTERIOR CERVICAL DECOMPRESSION/DISCECTOMY FUSION 4 LEVELS N/A 01/05/2021   Procedure: ANTERIOR CERVICAL DECOMPRESSION FUSION CERVICAL 3- CERVICAL4, CERVICAL 4- CERVICAL 5, CERVICAL, CERVICAL 5- CERVICAL 6 WITH INSTRUMENTATION AND ALLOGRAFT;  Surgeon: Beuford Anes, MD;  Location: [facility] OR;  Service: Orthopedics;  Laterality: N/A;   BIOPSY  08/13/2019   Procedure: BIOPSY;  Surgeon: Shaaron Lamar HERO, MD;  Location: [facility];  Service: Endoscopy;;  gastric esophageal   COLONOSCOPY N/A 10/22/2012   Dr. Shaaron: colonic diverticulosis. next TCS in 5 years due to [facility] CRC, colon polyps   COLONOSCOPY WITH PROPOFOL  N/A 08/13/2019   Dr. Shaaron: diverticulosis, long redundant colon. due to personal history of polyps, next TCS in five years   ESOPHAGOGASTRODUODENOSCOPY (EGD) WITH PROPOFOL  N/A 08/13/2019   Dr. Shaaron: 1cm salmon colored epithelium in distal esophagus (bx benign with no Barrett's), benign gastric polyp.   Ileocolonoscopy  10/02/2007   RMR: Friable anal canal, otherwise normal rectum.  Left-sided diverticula.  The colonic mucosa and  terminal ileum mucosa appeared normal rectum, left-sided diverticula   KNEE SURGERY     bilateral   plantar fasciitis Bilateral    SHOULDER SURGERY     bilateral   TOTAL HIP ARTHROPLASTY Left 09/01/2019    Procedure: TOTAL HIP ARTHROPLASTY ANTERIOR APPROACH;  Surgeon: Beverley Evalene BIRCH, MD;  Location: [facility]S;  Service: Orthopedics;  Laterality: Left;    Prior to Admission medications   Medication Sig Start Date End Date Taking? Authorizing Provider  canagliflozin  (INVOKANA ) 300 MG TABS tablet Take 300 mg by mouth daily before breakfast.   Yes [provider]  celecoxib  (CELEBREX ) 200 MG capsule Take 200 mg by mouth 2 (two) times daily as needed. 04/26/22  Yes [provider]  cetirizine (ZYRTEC) 10 MG tablet Take 10 mg by mouth daily. Allergies   Yes [provider]  empagliflozin  (JARDIANCE ) 25 MG TABS tablet Take 25 mg by mouth daily.   Yes [provider]  glipiZIDE  (GLUCOTROL  XL) 10 MG 24 hr tablet Take 10 mg by mouth daily. 06/19/19  Yes [provider]  HYDROcodone -acetaminophen  (NORCO) 10-325 MG tablet Take 1 tablet by mouth every 6 (six) hours as needed.   Yes [provider]  hyoscyamine  (LEVBID ) 0.375 MG 12 hr tablet Take 1 tablet (0.375 mg total) by mouth 2 (two) times daily. 05/05/24  Yes Jantzen Pilger, Lamar HERO, MD  leflunomide  (ARAVA ) 20 MG tablet Take 20 mg by mouth daily. 11/14/20  Yes [provider]  lisinopril  (ZESTRIL ) 10 MG tablet Take 10 mg by mouth daily. 06/23/19  Yes [provider]  metFORMIN  (GLUCOPHAGE ) 1000 MG tablet Take 1,000 mg by mouth 2 (two) times daily. 06/23/19  Yes [provider]  methocarbamol  (ROBAXIN ) 750 MG tablet Take 750 mg  by mouth every 6 (six) hours as needed.   Yes [provider]  pantoprazole  (PROTONIX ) 40 MG tablet TAKE 1 TABLET BY MOUTH  DAILY BEFORE BREAKFAST 03/14/21  Yes Shirlean Therisa ORN, NP  pioglitazone  (ACTOS ) 30 MG tablet Take 30 mg by mouth daily. 11/04/20  Yes [provider]  tamsulosin  (FLOMAX ) 0.4 MG CAPS capsule Take 0.4 mg by mouth at bedtime.   Yes [provider]  certolizumab pegol  (CIMZIA ) 2 X 200 MG KIT Inject 400 mg into the skin every 30  (thirty) days.    [provider]  TRULICITY 1.5 MG/0.5ML SOAJ Inject 1.5 mg into the skin once a week. 04/16/24   [provider]    Allergies as of 05/20/2024 - Review Complete 05/05/2024  Allergen Reaction Noted   Fish allergy Anaphylaxis, Swelling, and Other (See Comments) 10/08/2012    Family History  Problem Relation Age of Onset   Colon cancer Maternal Grandmother    Colon polyps Mother    Diabetes Mother    Hypertension Mother    Hyperlipidemia Mother    Diabetes Father     Social History   Socioeconomic History   Marital status: Married    Spouse name: Not on file   Number of children: Not on file   Years of education: Not on file   Highest education level: Not on file  Occupational History   Occupation: Training and development officer: [employer]  Tobacco Use   Smoking status: Never   Smokeless tobacco: Never  Vaping Use   Vaping status: Never Used  Substance and Sexual Activity   Alcohol use: No   Drug use: No   Sexual activity: Yes  Other Topics Concern   Not on file  Social History Narrative   Not on file   Social Drivers of Health   Financial Resource Strain: Low Risk  (05/02/2022)   Overall Financial Resource Strain (CARDIA)    Difficulty of Paying Living Expenses: Not hard at all  Food Insecurity: No Food Insecurity (05/02/2022)   Hunger Vital Sign    Worried About Running Out of Food in the Last Year: Never true    Ran Out of Food in the Last Year: Never true  Transportation Needs: No Transportation Needs (05/02/2022)   PRAPARE - Administrator, Civil Service (Medical): No    Lack of Transportation (Non-Medical): No  Physical Activity: Not on file  Stress: Not on file  Social Connections: Not on file  Intimate Partner Violence: Not on file    Review of Systems   See HPI, otherwise negative ROS  Physical Exam: BP (!) 155/91   Pulse 73   Temp 98.1 F (36.7 C) (Oral)   Resp (!) 9   Ht 6' (1.829 m)   Wt 108.9  kg   SpO2 98%   BMI 32.55 kg/m  General:   Alert,  Well-developed, well-nourished, pleasant and cooperative in NAD Mouth:  No deformity or lesions. Neck:  Supple; no masses or thyromegaly. No significant cervical adenopathy. Lungs:  Clear throughout to auscultation.   No wheezes, crackles, or rhonchi. No acute distress. Heart:  Regular rate and rhythm; no murmurs, clicks, rubs,  or gallops. Abdomen: Non-distended, normal bowel sounds.  Soft and nontender without appreciable mass or hepatosplenomegaly.    Impression/Plan:    63 year old gentleman here for high risk colonoscopy. The risks, benefits, limitations, alternatives and imponderables have been reviewed with the patient. Questions have been answered. All parties are  agreeable.     Notice: This dictation was prepared with Dragon dictation along with smaller phrase technology. Any transcriptional errors that result from this process are unintentional and may not be corrected upon review.

## 2024-06-11 NOTE — Discharge Instructions (Signed)
  Colonoscopy Discharge Instructions  Read the instructions outlined below and refer to this sheet in the next few weeks. These discharge instructions provide you with general information on caring for yourself after you leave the hospital. Your doctor may also give you specific instructions. While your treatment has been planned according to the most current medical practices available, unavoidable complications occasionally occur. If you have any problems or questions after discharge, call Dr. Shaaron at [phone]. ACTIVITY You may resume your regular activity, but move at a slower pace for the next 24 hours.  Take frequent rest periods for the next 24 hours.  Walking will help get rid of the air and reduce the bloated feeling in your belly (abdomen).  No driving for 24 hours (because of the medicine (anesthesia) used during the test).   Do not sign any important legal documents or operate any machinery for 24 hours (because of the anesthesia used during the test).  NUTRITION Drink plenty of fluids.  You may resume your normal diet as instructed by your doctor.  Begin with a light meal and progress to your normal diet. Heavy or fried foods are harder to digest and may make you feel sick to your stomach (nauseated).  Avoid alcoholic beverages for 24 hours or as instructed.  MEDICATIONS You may resume your normal medications unless your doctor tells you otherwise.  WHAT YOU CAN EXPECT TODAY Some feelings of bloating in the abdomen.  Passage of more gas than usual.  Spotting of blood in your stool or on the toilet paper.  IF YOU HAD POLYPS REMOVED DURING THE COLONOSCOPY: No aspirin  products for 7 days or as instructed.  No alcohol for 7 days or as instructed.  Eat a soft diet for the next 24 hours.  FINDING OUT THE RESULTS OF YOUR TEST Not all test results are available during your visit. If your test results are not back during the visit, make an appointment with your caregiver to find out the  results. Do not assume everything is normal if you have not heard from your caregiver or the medical facility. It is important for you to follow up on all of your test results.  SEEK IMMEDIATE MEDICAL ATTENTION IF: You have more than a spotting of blood in your stool.  Your belly is swollen (abdominal distention).  You are nauseated or vomiting.  You have a temperature over 101.  You have abdominal pain or discomfort that is severe or gets worse throughout the day.    No polyps found today  You do have mild diverticulosis  Recommend repeat colonoscopy in 5 years.

## 2024-06-11 NOTE — Anesthesia Postprocedure Evaluation (Signed)
 Anesthesia Post Note  Patient: Steve Mccormick  Procedure(s) Performed: COLONOSCOPY  Patient location during evaluation: Endoscopy Anesthesia Type: General Level of consciousness: awake and alert Pain management: pain level controlled Vital Signs Assessment: post-procedure vital signs reviewed and stable Respiratory status: spontaneous breathing, nonlabored ventilation and respiratory function stable Cardiovascular status: stable Anesthetic complications: no   There were no known notable events for this encounter.   Last Vitals:  Vitals:   06/11/24 0810 06/11/24 0815  BP: 93/69 126/68  Pulse: 74 78  Resp: 16 14  Temp: 37.1 C   SpO2: 97% 97%    Last Pain:  Vitals:   06/11/24 0810  TempSrc: Oral  PainSc: 0-No pain                 Sherrice Creekmore L Staci Carver

## 2024-06-11 NOTE — Op Note (Signed)
 [facility] Patient Name: Steve Mccormick Procedure Date: 06/11/2024 7:40 AM MRN: [id number] Date of Birth: [birth year] Attending MD: Lamar Ozell Hollingshead , MD, [id number] CSN: [id number] Age: 63 Admit Type: Outpatient Procedure:                Colonoscopy Indications:              High risk colon cancer surveillance: Personal                            history of colonic polyps Providers:                Lamar Ozell Hollingshead, MD, Jon LABOR. Gerome RN, RN,                            Italy Wilson, Technician Referring MD:              Medicines:                Propofol  per Anesthesia Complications:            No immediate complications. Estimated Blood Loss:     Estimated blood loss: none. Procedure:                Pre-Anesthesia Assessment:                           - Prior to the procedure, a History and Physical                            was performed, and patient medications and                            allergies were reviewed. The patient's tolerance of                            previous anesthesia was also reviewed. The risks                            and benefits of the procedure and the sedation                            options and risks were discussed with the patient.                            All questions were answered, and informed consent                            was obtained. Prior Anticoagulants: The patient has                            taken no anticoagulant or antiplatelet agents. ASA                            Grade Assessment: II - A patient with mild systemic  disease. After reviewing the risks and benefits,                            the patient was deemed in satisfactory condition to                            undergo the procedure.                           After obtaining informed consent, the colonoscope                            was passed under direct vision. Throughout the                            procedure, the  patient's blood pressure, pulse, and                            oxygen saturations were monitored continuously. The                            [id number] ([id number]) Colon was introduced through                            the anus and advanced to the the cecum, identified                            by appendiceal orifice and ileocecal valve. The                            colonoscopy was performed without difficulty. The                            patient tolerated the procedure well. The quality                            of the bowel preparation was adequate. The                            ileocecal valve, appendiceal orifice, and rectum                            were photographed. Scope In: 7:50:53 AM Scope Out: 8:07:25 AM Scope Withdrawal Time: 0 hours 11 minutes 13 seconds  Total Procedure Duration: 0 hours 16 minutes 32 seconds  Findings:      The perianal and digital rectal examinations were normal.      Scattered medium-mouthed diverticula were found in the sigmoid colon and       descending colon.      The exam was otherwise without abnormality on direct and retroflexion       views. Impression:               - Diverticulosis in the sigmoid colon and in the  descending colon.                           - The examination was otherwise normal on direct                            and retroflexion views.                           - No specimens collected. Moderate Sedation:      Moderate (conscious) sedation was personally administered by an       anesthesia professional. The following parameters were monitored: oxygen       saturation, heart rate, blood pressure, respiratory rate, EKG, adequacy       of pulmonary ventilation, and response to care. Recommendation:           - Patient has a contact number available for                            emergencies. The signs and symptoms of potential                            delayed complications were discussed  with the                            patient. Return to normal activities tomorrow.                            Written discharge instructions were provided to the                            patient.                           - Advance diet as tolerated. Continue present                            medications. A repeat colonoscopy in 5 years Procedure Code(s):        --- Professional ---                           [phone], Colonoscopy, flexible; diagnostic, including                            collection of specimen(s) by brushing or washing,                            when performed (separate procedure) Diagnosis Code(s):        --- Professional ---                           Z86.010, Personal history of colonic polyps                           K57.30, Diverticulosis of large intestine without  perforation or abscess without bleeding CPT copyright 2022 [employer]. All rights reserved. The codes documented in this report are preliminary and upon coder review may  be revised to meet current compliance requirements. Lamar HERO. Ernesta Trabert, MD Lamar Ozell Hollingshead, MD 06/11/2024 8:33:27 AM This report has been signed electronically. Number of Addenda: 0

## 2024-06-18 ENCOUNTER — Encounter (HOSPITAL_COMMUNITY): Payer: Self-pay | Admitting: Internal Medicine

## 2024-07-17 DIAGNOSIS — E118 Type 2 diabetes mellitus with unspecified complications: Secondary | ICD-10-CM | POA: Diagnosis not present

## 2024-07-17 DIAGNOSIS — Z79899 Other long term (current) drug therapy: Secondary | ICD-10-CM | POA: Diagnosis not present

## 2024-07-30 DIAGNOSIS — L4050 Arthropathic psoriasis, unspecified: Secondary | ICD-10-CM | POA: Diagnosis not present

## 2024-07-30 DIAGNOSIS — I10 Essential (primary) hypertension: Secondary | ICD-10-CM | POA: Diagnosis not present

## 2024-07-30 DIAGNOSIS — E1169 Type 2 diabetes mellitus with other specified complication: Secondary | ICD-10-CM | POA: Diagnosis not present

## 2024-07-30 DIAGNOSIS — G720 Drug-induced myopathy: Secondary | ICD-10-CM | POA: Diagnosis not present

## 2024-08-04 DIAGNOSIS — L4052 Psoriatic arthritis mutilans: Secondary | ICD-10-CM | POA: Diagnosis not present

## 2024-08-04 DIAGNOSIS — G894 Chronic pain syndrome: Secondary | ICD-10-CM | POA: Diagnosis not present

## 2024-08-04 DIAGNOSIS — M47816 Spondylosis without myelopathy or radiculopathy, lumbar region: Secondary | ICD-10-CM | POA: Diagnosis not present

## 2024-08-04 DIAGNOSIS — M62830 Muscle spasm of back: Secondary | ICD-10-CM | POA: Diagnosis not present
